# Patient Record
Sex: Female | Born: 1990
Health system: Southern US, Community
[De-identification: ages and names within clinical notes are randomized; demographics above are authoritative.]

## PROBLEM LIST (undated history)

## (undated) DIAGNOSIS — E079 Disorder of thyroid, unspecified: Secondary | ICD-10-CM

## (undated) DIAGNOSIS — F419 Anxiety disorder, unspecified: Secondary | ICD-10-CM

## (undated) HISTORY — DX: Disorder of thyroid, unspecified: E07.9

## (undated) HISTORY — DX: Anxiety disorder, unspecified: F41.9

---
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
## 2013-05-29 ENCOUNTER — Inpatient Hospital Stay (HOSPITAL_COMMUNITY)
Admission: AD | Admit: 2013-05-29 | Discharge: 2013-05-29 | Disposition: A | Discharge status: Home / Self Care | Payer: BC Managed Care – PPO | Source: Ambulatory Visit | Attending: Obstetrics & Gynecology | Admitting: Obstetrics & Gynecology

## 2013-05-29 NOTE — MAU Note (Signed)
Pt called, not in lobby 

## 2013-11-30 ENCOUNTER — Other Ambulatory Visit: Payer: BC Managed Care – PPO

## 2013-12-01 ENCOUNTER — Other Ambulatory Visit: Payer: BC Managed Care – PPO

## 2013-12-01 ENCOUNTER — Other Ambulatory Visit: Payer: Self-pay | Admitting: Endocrinology

## 2013-12-01 ENCOUNTER — Other Ambulatory Visit: Payer: Self-pay | Admitting: *Deleted

## 2013-12-01 DIAGNOSIS — E039 Hypothyroidism, unspecified: Secondary | ICD-10-CM | POA: Insufficient documentation

## 2013-12-02 LAB — T4, FREE: Free T4: 1.29 ng/dL (ref 0.80–1.80)

## 2013-12-02 LAB — TSH: TSH: 1.118 u[IU]/mL (ref 0.350–4.500)

## 2013-12-03 ENCOUNTER — Ambulatory Visit (INDEPENDENT_AMBULATORY_CARE_PROVIDER_SITE_OTHER): Payer: BC Managed Care – PPO | Admitting: Endocrinology

## 2013-12-03 ENCOUNTER — Encounter: Payer: Self-pay | Admitting: Endocrinology

## 2013-12-03 VITALS — BP 114/72 | HR 100 | Temp 97.8°F | Resp 16 | Ht 66.5 in | Wt 142.8 lb

## 2013-12-03 DIAGNOSIS — E063 Autoimmune thyroiditis: Secondary | ICD-10-CM | POA: Insufficient documentation

## 2013-12-03 NOTE — Progress Notes (Signed)
Patient ID: Alyssa Mccarty, female   DOB: 07-19-1991, 23 y.o.   MRN: [id number]    Reason for Appointment: Goiter followup visit   History of Present Illness:   Her detailed history is not available from previous records at present. However a couple of years ago she noticed a swelling in her neck and was found to have a goiter Evaluation showed that she had Hashimoto thyroiditis with positive antibodies but no hypothyroidism. Because of significant enlargement of her thyroid she was started on thyroid suppressive treatment Her thyroid enlargement was improving with this but she has not been seen in followup for several months now  She ran out of her medication about a month ago. Does not think she notices any swelling in her neck She does complain of feeling tired for about 3 months or so and not any worse without her thyroid supplement No complaints of cold intolerance or hair loss   Orders Only on 12/01/2013  Component Date Value Ref Range Status  . TSH 12/01/2013 1.118  0.350 - 4.500 uIU/mL Final  . Free T4 12/01/2013 1.29  0.80 - 1.80 ng/dL Final      Medication List       This list is accurate as of: 12/03/13  3:55 PM.  Always use your most recent med list.               levonorgestrel-ethinyl estradiol 0.15-0.03 MG tablet  Commonly known as:  SEASONALE,INTROVALE,JOLESSA     levothyroxine 75 MCG tablet  Commonly known as:  SYNTHROID, LEVOTHROID  Take 75 mcg by mouth daily.     LORazepam 0.5 MG tablet  Commonly known as:  ATIVAN  Take 0.5 mg by mouth every 8 (eight) hours.     rizatriptan 10 MG tablet  Commonly known as:  MAXALT     sertraline 100 MG tablet  Commonly known as:  ZOLOFT  Take 100 mg by mouth daily.        Allergies:  Allergies  Allergen Reactions  . Septra [Bactrim] Swelling    FACE SWELLING    No past medical history on file.  No past surgical history on file.  No family history on file.  Social History:  reports that she has never  smoked. She does not have any smokeless tobacco history on file. Her alcohol and drug histories are not on file.  REVIEW Of SYSTEMS:  History of migraines History of anxiety   Examination:   BP 114/72  Pulse 100  Temp(Src) 97.8 F (36.6 C)  Resp 16  Ht 5' 6.5" (1.689 m)  Wt 142 lb 12.8 oz (64.774 kg)  BMI 22.71 kg/m2  SpO2 98%  GENERAL: Pleasant and well-looking, no puffiness of the face or eyes  THYROID: Right lobe was just palpable, has minimal enlargement which is soft. Left lobe is not palpable         NEUROLOGIC EXAM:  biceps reflexes show normal relaxation Skin: Not unusual dry    Assessment   History of Hashimoto thyroiditis with goiter Currently the patient has almost complete resolution of her goiter She is still euthyroid without any thyroid supplement for at least a month   Treatment:  Will observe her without any thyroid supplement for 6 months If she has any increase in size of her goiter will restart thyroid supplement and also periodically follow thyroid function   Azilee Pirro 12/03/2013, 3:55 PM

## 2013-12-03 NOTE — Patient Instructions (Signed)
No Rx

## 2014-06-04 ENCOUNTER — Other Ambulatory Visit (INDEPENDENT_AMBULATORY_CARE_PROVIDER_SITE_OTHER): Payer: BC Managed Care – PPO

## 2014-06-04 DIAGNOSIS — E063 Autoimmune thyroiditis: Secondary | ICD-10-CM

## 2014-06-04 LAB — TSH: TSH: 0.59 u[IU]/mL (ref 0.35–5.50)

## 2014-06-04 LAB — T4, FREE: Free T4: 0.98 ng/dL (ref 0.60–1.60)

## 2014-06-10 ENCOUNTER — Encounter: Payer: Self-pay | Admitting: Endocrinology

## 2014-06-10 ENCOUNTER — Ambulatory Visit (INDEPENDENT_AMBULATORY_CARE_PROVIDER_SITE_OTHER): Payer: BC Managed Care – PPO | Admitting: Endocrinology

## 2014-06-10 VITALS — BP 124/80 | HR 112 | Temp 99.3°F | Ht 66.5 in | Wt 138.0 lb

## 2014-06-10 DIAGNOSIS — E063 Autoimmune thyroiditis: Secondary | ICD-10-CM

## 2014-06-10 NOTE — Progress Notes (Signed)
Patient ID: Alyssa Mccarty, female   DOB: Mar 10, 1991, 23 y.o.   MRN: [id number]    Reason for Appointment: Goiter followup visit   History of Present Illness:   However in 2010 she noticed a swelling in her neck and was found to have a goiter Evaluation showed that she had Hashimoto thyroiditis with positive antibodies with a titer of 625 but no hypothyroidism. Highest TSH in the past has been 5.5 Because of significant enlargement of her thyroid she was given thyroid suppressive treatment She was taking as much as 88 mcg Her thyroid enlargement and improved significantly with this   She told to stop her thyroid supplement in 3/19 and she is now here for followup She feels fairly well without any unusual fatigue She thinks her right neck is somewhat larger  Lab Results  Component Value Date   FREET4 0.98 06/04/2014   FREET4 1.29 12/01/2013   TSH 0.59 06/04/2014   TSH 1.118 12/01/2013       Medication List       This list is accurate as of: 06/10/14  3:35 PM.  Always use your most recent med list.               amphetamine-dextroamphetamine 30 MG 24 hr capsule  Commonly known as:  ADDERALL XR     levonorgestrel-ethinyl estradiol 0.15-0.03 MG tablet  Commonly known as:  SEASONALE,INTROVALE,JOLESSA     LORazepam 0.5 MG tablet  Commonly known as:  ATIVAN  Take 0.5 mg by mouth every 8 (eight) hours.     rizatriptan 10 MG tablet  Commonly known as:  MAXALT     sertraline 100 MG tablet  Commonly known as:  ZOLOFT  Take 100 mg by mouth daily.        Allergies:  Allergies  Allergen Reactions  . Septra [Bactrim] Swelling    FACE SWELLING    No past medical history on file.  No past surgical history on file.  No family history on file.  Social History:  reports that she has never smoked. She does not have any smokeless tobacco history on file. Her alcohol and drug histories are not on file.  REVIEW Of SYSTEMS:  History of migraines History of anxiety   Examination:   BP 124/80  Pulse 112  Temp(Src) 99.3 F (37.4 C) (Oral)  Ht 5' 6.5" (1.689 m)  Wt 138 lb (62.596 kg)  BMI 21.94 kg/m2  SpO2 97%  GENERAL: Pleasant and well-looking, no puffiness of the face or eyes  THYROID: Right lobe shows minimal enlargement which is soft. Left lobe is not palpable         NEUROLOGIC EXAM:  biceps reflexes show normal relaxation    Assessment   History of Hashimoto thyroiditis with goiter Again the patient has almost complete resolution of her goiter She is still euthyroid without any thyroid supplement since 10/2013   Treatment:  Will observe her without any thyroid supplement and follow annually Explained to patient how to do a self-exam of her thyroid and patient handout given  If she has any significant increase in size of her goiter in the future will restart thyroid supplement and also periodically follow thyroid function   Alyssa Mccarty 06/10/2014, 3:35 PM

## 2014-07-10 ENCOUNTER — Ambulatory Visit (INDEPENDENT_AMBULATORY_CARE_PROVIDER_SITE_OTHER): Payer: BC Managed Care – PPO | Admitting: Physician Assistant

## 2014-07-10 VITALS — BP 116/60 | HR 99 | Temp 98.4°F | Resp 20 | Ht 66.25 in | Wt 138.4 lb

## 2014-07-10 DIAGNOSIS — R1030 Lower abdominal pain, unspecified: Secondary | ICD-10-CM

## 2014-07-10 DIAGNOSIS — K5900 Constipation, unspecified: Secondary | ICD-10-CM

## 2014-07-10 DIAGNOSIS — R350 Frequency of micturition: Secondary | ICD-10-CM

## 2014-07-10 LAB — POCT URINALYSIS DIPSTICK
BILIRUBIN UA: NEGATIVE
Glucose, UA: NEGATIVE
Nitrite, UA: NEGATIVE
Protein, UA: NEGATIVE
RBC UA: NEGATIVE
Spec Grav, UA: 1.025
Urobilinogen, UA: 0.2
pH, UA: 6

## 2014-07-10 LAB — POCT UA - MICROSCOPIC ONLY
Casts, Ur, LPF, POC: NEGATIVE
Crystals, Ur, HPF, POC: NEGATIVE
MUCUS UA: NEGATIVE
RBC, URINE, MICROSCOPIC: NEGATIVE
Yeast, UA: NEGATIVE

## 2014-07-10 LAB — POCT URINE PREGNANCY: Preg Test, Ur: NEGATIVE

## 2014-07-10 MED ORDER — POLYETHYLENE GLYCOL 3350 17 GM/SCOOP PO POWD: ORAL | Status: DC

## 2014-07-10 NOTE — Progress Notes (Signed)
Subjective:    Patient ID: Alyssa ArchKelsey Mccarty, female    DOB: April 14, 1991, 23 y.o.   MRN: [id number]  HPI  This is a 23 year old female with PMH ADD, and anxiety who is presenting with 1 week of constipation and lower abdominal pain. She reports she usually has bowel movements every 2-3 days, stool is soft. After 4 days without a bowel movement she took a stool softener. The next day she had a liquid stool. Two days later she took another stool softener and the next day she had a small liquid stool. She has had crampy lower abdominal pain and bloating intermittently over the past week and has had some relief with bowel movements. She denies nausea, vomiting, blood in stool, dysuria. She has noticed some urinary frequency.  She is also noting that 4-5 weeks ago she missed 5 days of her OCP. She had breakthrough bleeding at that time. She took a pregnancy test 2 weeks after and negative. She is still worried about this. She takes seasonale and only gets a period every 90 days.  Review of Systems  Constitutional: Negative.   Gastrointestinal: Positive for abdominal pain, diarrhea and constipation. Negative for nausea, vomiting and blood in stool.  Genitourinary: Positive for frequency. Negative for dysuria.      Objective:   Physical Exam  Constitutional: She is oriented to person, place, and time. She appears well-developed and well-nourished. No distress.  HENT:  Head: Normocephalic and atraumatic.  Right Ear: Hearing normal.  Left Ear: Hearing normal.  Eyes: Conjunctivae and lids are normal. Right eye exhibits no discharge. Left eye exhibits no discharge. No scleral icterus.  Cardiovascular: Normal rate, regular rhythm, normal heart sounds and intact distal pulses.   Pulmonary/Chest: Effort normal and breath sounds normal. No respiratory distress. She has no wheezes. She has no rhonchi. She has no rales.  Abdominal: Soft. Normal appearance and bowel sounds are normal. She exhibits no distension  and no mass. There is no tenderness. There is no CVA tenderness.  Neurological: She is alert and oriented to person, place, and time.  Skin: Skin is warm, dry and intact. No rash noted. She is not diaphoretic.  Psychiatric: She has a normal mood and affect. Her speech is normal and behavior is normal. Thought content normal.    Results for orders placed in visit on 07/10/14  POCT UA - MICROSCOPIC ONLY      Result Value Ref Range   WBC, Ur, HPF, POC 5-7     RBC, urine, microscopic neg     Bacteria, U Microscopic 2+     Mucus, UA neg     Epithelial cells, urine per micros 5-7     Crystals, Ur, HPF, POC neg     Casts, Ur, LPF, POC neg     Yeast, UA neg    POCT URINALYSIS DIPSTICK      Result Value Ref Range   Color, UA yellow     Clarity, UA clear     Glucose, UA neg     Bilirubin, UA neg     Ketones, UA trace     Spec Grav, UA 1.025     Blood, UA neg     pH, UA 6.0     Protein, UA neg     Urobilinogen, UA 0.2     Nitrite, UA neg     Leukocytes, UA Trace    POCT URINE PREGNANCY      Result Value Ref Range   Preg  Test, Ur Negative        Assessment & Plan:  1. Frequency of urination 2. Lower abdominal pain  UA negative, uHCG negative. Urinary frequency is likely due to constipation.  - POCT UA - Microscopic Only - POCT urinalysis dipstick - POCT urine pregnancy  3. Constipation, unspecified constipation type  She will take 17 gm mirlax BID x 3 days, then 17 gm QD x 7 days. If no improvement, she will return to clinic.  - polyethylene glycol powder (GLYCOLAX/MIRALAX) powder; Take 1 capfull in 8 oz water BID x 3 days, then QD x 7 days  Dispense: 3350 g; Refill: 1   Cherish Runde V. Dyke BrackettBush, PA-C, MHS [facility]  07/10/2014

## 2014-07-10 NOTE — Patient Instructions (Signed)
Mix 1 capfull of miralax with 8 oz water twice a day for 3 days, then once a day for 7 days. If you are having diarrhea from this, you may cut back to 1/2 capfull.  Return if symptoms not improved in 7-10 days.

## 2014-07-11 NOTE — Progress Notes (Signed)
I was directly involved with the patient's care and agree with the physical, diagnosis and treatment plan.  

## 2014-07-25 ENCOUNTER — Ambulatory Visit (INDEPENDENT_AMBULATORY_CARE_PROVIDER_SITE_OTHER): Payer: BC Managed Care – PPO | Admitting: Family Medicine

## 2014-07-25 VITALS — BP 133/76 | HR 115 | Temp 98.9°F | Resp 20 | Ht 66.25 in | Wt 139.8 lb

## 2014-07-25 DIAGNOSIS — J069 Acute upper respiratory infection, unspecified: Secondary | ICD-10-CM

## 2014-07-25 MED ORDER — GUAIFENESIN ER 1200 MG PO TB12: 1.0000 | ORAL_TABLET | Freq: Two times a day (BID) | ORAL | Status: DC | PRN

## 2014-07-25 MED ORDER — IPRATROPIUM BROMIDE 0.03 % NA SOLN: 2.0000 | Freq: Two times a day (BID) | NASAL | Status: DC

## 2014-07-25 MED ORDER — HYDROCOD POLST-CHLORPHEN POLST 10-8 MG/5ML PO LQCR: 5.0000 mL | Freq: Two times a day (BID) | ORAL | Status: DC | PRN

## 2014-07-25 NOTE — Progress Notes (Signed)
Subjective:    Patient ID: Alyssa Mccarty, female    DOB: Feb 09, 1991, 23 y.o.   MRN: [id number] Patient Active Problem List   Diagnosis Date Noted  . Hashimoto's thyroiditis 12/03/2013  . Unspecified hypothyroidism 12/01/2013   Prior to Admission medications   Medication Sig Start Date End Date Taking? Authorizing Provider  amphetamine-dextroamphetamine (ADDERALL XR) 30 MG 24 hr capsule  06/03/14  Yes Historical Provider, MD  levonorgestrel-ethinyl estradiol (SEASONALE,INTROVALE,JOLESSA) 0.15-0.03 MG tablet  11/25/13  Yes Historical Provider, MD  LORazepam (ATIVAN) 0.5 MG tablet Take 0.5 mg by mouth every 8 (eight) hours.   Yes Historical Provider, MD  polyethylene glycol powder (GLYCOLAX/MIRALAX) powder Take 1 capfull in 8 oz water BID x 3 days, then QD x 7 days 07/10/14  Yes Lanier ClamNicole Dhriti Fales V, PA-C  rizatriptan (MAXALT) 10 MG tablet  10/08/13  Yes Historical Provider, MD  sertraline (ZOLOFT) 100 MG tablet Take 50 mg by mouth daily.    Yes Historical Provider, MD                        Allergies  Allergen Reactions  . Septra [Bactrim] Swelling    FACE SWELLING   HPI  This is a 23 year old female presenting with 3 days of cough, nasal congestion, hoarseness and sinus pressure. She is coughing up yellow mucous. The cough is worse at night and she is not sleeping well. She has tried Cater[employer] manageralka seltzer and cough drops with some relief. She denies sore throat, otalgia, fevers, chills, SOB or wheezing. She does not have any sick contacts. She does not have a history of asthma and she is not a smoker.  Review of Systems  Constitutional: Negative for fever and chills.  HENT: Positive for congestion, sinus pressure and voice change. Negative for ear pain and sore throat.   Eyes: Negative.   Respiratory: Positive for cough. Negative for shortness of breath and wheezing.   Gastrointestinal: Negative for nausea, vomiting, diarrhea and constipation.  Skin: Negative.       Objective:   Physical Exam    Constitutional: She is oriented to person, place, and time. She appears well-developed and well-nourished. No distress.  HENT:  Head: Normocephalic and atraumatic.  Right Ear: Hearing, tympanic membrane, external ear and ear canal normal.  Left Ear: Hearing, tympanic membrane, external ear and ear canal normal.  Nose: Mucosal edema present.  Mouth/Throat: Uvula is midline and mucous membranes are normal. Posterior oropharyngeal erythema present. No oropharyngeal exudate or posterior oropharyngeal edema.  Eyes: Conjunctivae and lids are normal. Right eye exhibits no discharge. Left eye exhibits no discharge. No scleral icterus.  Cardiovascular: Regular rhythm, normal heart sounds and normal pulses.   No murmur heard. Tachycardic today - normal for her. She is on amphetamine for ADD  Pulmonary/Chest: Effort normal and breath sounds normal. No respiratory distress. She has no wheezes. She has no rhonchi. She has no rales.  Musculoskeletal: Normal range of motion.  Lymphadenopathy:       Head (right side): No submental, no submandibular, no tonsillar, no preauricular, no posterior auricular and no occipital adenopathy present.       Head (left side): No submental, no submandibular, no tonsillar, no preauricular, no posterior auricular and no occipital adenopathy present.    She has cervical adenopathy.       Right cervical: Superficial cervical adenopathy present. No deep cervical and no posterior cervical adenopathy present.      Left cervical: Superficial cervical adenopathy  present. No deep cervical and no posterior cervical adenopathy present.  Neurological: She is alert and oriented to person, place, and time.  Skin: Skin is warm, dry and intact. No lesion and no rash noted.  Psychiatric: She has a normal mood and affect. Her speech is normal and behavior is normal. Thought content normal.  BP 133/76 mmHg  Pulse 115  Temp(Src) 98.9 F (37.2 C) (Oral)  Resp 20  Ht 5' 6.25" (1.683 m)  Wt  139 lb 12.8 oz (63.413 kg)  BMI 22.39 kg/m2  SpO2 98%     Assessment & Plan:  1. Viral URI This is likely a viral URI. Focus is on supportive care. Will return in 7-10 days if symptoms worsen or fail to improve.  - ipratropium (ATROVENT) 0.03 % nasal spray; Place 2 sprays into both nostrils 2 (two) times daily.  Dispense: 30 mL; Refill: 0 - chlorpheniramine-HYDROcodone (TUSSIONEX PENNKINETIC ER) 10-8 MG/5ML LQCR; Take 5 mLs by mouth every 12 (twelve) hours as needed for cough (cough).  Dispense: 100 mL; Refill: 0 - Guaifenesin (MUCINEX MAXIMUM STRENGTH) 1200 MG TB12; Take 1 tablet (1,200 mg total) by mouth every 12 (twelve) hours as needed.  Dispense: 14 tablet; Refill: 1   Alyssa Taylor V. Dyke BrackettBush, PA-C, MHS [facility]  07/26/2014

## 2014-07-25 NOTE — Patient Instructions (Addendum)
Drink plenty of water and get plenty of rest. Use atrovent and mucinex while you are having symptoms. Cough syrup will make you drowsy. Return in 7-10 days if not improving.

## 2014-08-04 NOTE — Progress Notes (Signed)
Reviewed documentation and agree w/ assessment and plan. Klair Leising, MD MPH 

## 2015-02-08 ENCOUNTER — Telehealth: Payer: Self-pay

## 2015-02-08 ENCOUNTER — Ambulatory Visit (INDEPENDENT_AMBULATORY_CARE_PROVIDER_SITE_OTHER): Payer: 59 | Admitting: Physician Assistant

## 2015-02-08 VITALS — BP 128/78 | HR 98 | Temp 98.7°F | Resp 16 | Ht 67.0 in | Wt 136.6 lb

## 2015-02-08 DIAGNOSIS — B9789 Other viral agents as the cause of diseases classified elsewhere: Secondary | ICD-10-CM

## 2015-02-08 DIAGNOSIS — R05 Cough: Secondary | ICD-10-CM | POA: Diagnosis not present

## 2015-02-08 DIAGNOSIS — R059 Cough, unspecified: Secondary | ICD-10-CM

## 2015-02-08 DIAGNOSIS — J988 Other specified respiratory disorders: Secondary | ICD-10-CM

## 2015-02-08 DIAGNOSIS — B349 Viral infection, unspecified: Secondary | ICD-10-CM

## 2015-02-08 LAB — POCT CBC
Granulocyte percent: 66.1 %G (ref 37–80)
HEMATOCRIT: 37 % — AB (ref 37.7–47.9)
Hemoglobin: 10.9 g/dL — AB (ref 12.2–16.2)
LYMPH, POC: 2.1 (ref 0.6–3.4)
MCH, POC: 21.3 pg — AB (ref 27–31.2)
MCHC: 29.5 g/dL — AB (ref 31.8–35.4)
MCV: 72.4 fL — AB (ref 80–97)
MID (cbc): 0.8 (ref 0–0.9)
MPV: 7.1 fL (ref 0–99.8)
POC Granulocyte: 5.6 (ref 2–6.9)
POC LYMPH %: 24.5 % (ref 10–50)
POC MID %: 9.4 %M (ref 0–12)
Platelet Count, POC: 318 10*3/uL (ref 142–424)
RBC: 5.11 M/uL (ref 4.04–5.48)
RDW, POC: 17.5 %
WBC: 8.5 10*3/uL (ref 4.6–10.2)

## 2015-02-08 MED ORDER — HYDROCOD POLST-CPM POLST ER 10-8 MG/5ML PO SUER: 5.0000 mL | Freq: Two times a day (BID) | ORAL | Status: DC | PRN

## 2015-02-08 MED ORDER — NAPROXEN 500 MG PO TABS: 500.0000 mg | ORAL_TABLET | Freq: Two times a day (BID) | ORAL | Status: DC

## 2015-02-08 MED ORDER — BENZONATATE 100 MG PO CAPS: 100.0000 mg | ORAL_CAPSULE | Freq: Three times a day (TID) | ORAL | Status: DC | PRN

## 2015-02-08 NOTE — Progress Notes (Signed)
02/08/2015 at 2:39 PM  Adelina MingsKelsey Carreira / DOB: [birth year] / MRN: [id number]  The patient has Unspecified hypothyroidism and Hashimoto's thyroiditis on her problem list.  SUBJECTIVE  Chief complaint: Cough  Manfred ArchKelsey Schlechter is a 24 y.o. female complaining of dry cough and sinus and nasal congestion that started 4 days ago.  Associated symptoms include sore throat today, and she denies fever, difficulty breathing, headache and jaw pain.The patient symptoms are worsening. Treatments tried thus far include Loratidine with fair  relief. She reports sick contacts.   She  has a past medical history of Anxiety and Thyroid disease.    Medications reviewed and updated by myself where necessary, and exist elsewhere in the encounter.   Ms. Sherlyn LickRedmon is allergic to septra. She  reports that she has never smoked. She has never used smokeless tobacco. She reports that she drinks alcohol. She reports that she does not use illicit drugs. She  has no sexual activity history on file. The patient  has no past surgical history on file.  Her family history includes Diabetes in her paternal grandfather; Hyperlipidemia in her father; Hypertension in her father.  Review of Systems  Constitutional: Negative for fever and chills.  Respiratory: Negative for shortness of breath and wheezing.   Cardiovascular: Negative for chest pain and palpitations.  Gastrointestinal: Negative for heartburn, nausea, vomiting, abdominal pain, diarrhea and constipation.  Genitourinary: Negative.   Musculoskeletal: Negative for myalgias.  Skin: Negative for itching and rash.  Neurological: Negative for dizziness and headaches.    OBJECTIVE  Her  height is 5\' 7"  (1.702 m) and weight is 136 lb 9.6 oz (61.961 kg). Her oral temperature is 98.7 F (37.1 C). Her blood pressure is 128/78 and her pulse is 98. Her respiration is 16 and oxygen saturation is 99%.  The patient's body mass index is 21.39 kg/(m^2).  Physical Exam  Constitutional:  She is oriented to person, place, and time. She appears well-developed and well-nourished.  HENT:  Right Ear: Hearing, external ear and ear canal normal.  Left Ear: Hearing, tympanic membrane, external ear and ear canal normal.  Nose: Mucosal edema present.  Mouth/Throat: Uvula is midline, oropharynx is clear and moist and mucous membranes are normal. No uvula swelling. No oropharyngeal exudate, posterior oropharyngeal edema, posterior oropharyngeal erythema or tonsillar abscesses.  Neck: Normal range of motion.  Cardiovascular: Normal rate and regular rhythm.   Respiratory: Effort normal and breath sounds normal. No respiratory distress. She has no wheezes. She has no rales. She exhibits no tenderness.  GI: Soft. Bowel sounds are normal.  Musculoskeletal: Normal range of motion.  Neurological: She is alert and oriented to person, place, and time.  Skin: Skin is warm and dry.  Psychiatric: She has a normal mood and affect.    Results for orders placed or performed in visit on 02/08/15 (from the past 24 hour(s))  POCT CBC     Status: Abnormal   Collection Time: 02/08/15  2:28 PM  Result Value Ref Range   WBC 8.5 4.6 - 10.2 K/uL   Lymph, poc 2.1 0.6 - 3.4   POC LYMPH PERCENT 24.5 10 - 50 %L   MID (cbc) 0.8 0 - 0.9   POC MID % 9.4 0 - 12 %M   POC Granulocyte 5.6 2 - 6.9   Granulocyte percent 66.1 37 - 80 %G   RBC 5.11 4.04 - 5.48 M/uL   Hemoglobin 10.9 (A) 12.2 - 16.2 g/dL   HCT, POC 46.937.0 (A) 62.937.7 - 47.9 %  MCV 72.4 (A) 80 - 97 fL   MCH, POC 21.3 (A) 27 - 31.2 pg   MCHC 29.5 (A) 31.8 - 35.4 g/dL   RDW, POC 29.5 %   Platelet Count, POC 318 142 - 424 K/uL   MPV 7.1 0 - 99.8 fL    ASSESSMENT & PLAN  Karrigan was seen today for cough.  Diagnoses and all orders for this visit:  Cough: CBC and exam reassuring.  Will treat conservatively for now.  Patient advised to call if she develops SOB, DOE, chest pain, or fever greater than 101.   Orders: -     POCT CBC -     benzonatate  (TESSALON) 100 MG capsule; Take 1-2 capsules (100-200 mg total) by mouth 3 (three) times daily as needed for cough. -     Tussionex: Patient with allergy according to epic, however she has received this in the last three months and tolerated without adverse effects.   Viral respiratory infection Orders: -     naproxen (NAPROSYN) 500 MG tablet; Take 1 tablet (500 mg total) by mouth 2 (two) times daily with a meal.    The patient was advised to call or come back to clinic if she does not see an improvement in symptoms, or worsens with the above plan.   Deliah Boston, MHS, PA-C [facility] 02/08/2015 2:39 PM

## 2015-02-08 NOTE — Telephone Encounter (Signed)
Done.  Upfront and waiting for pt.

## 2015-02-08 NOTE — Telephone Encounter (Signed)
Alyssa Mccarty,    Patient would like cough medicine called into pharmacy.   States she had no trouble with it last fall when Lanier ClamNicole Bush prescribed it for her.   Patient was seen today.    [phone] (H)

## 2015-02-10 NOTE — Telephone Encounter (Signed)
Pt.notified

## 2016-09-27 ENCOUNTER — Encounter (HOSPITAL_COMMUNITY): Payer: Self-pay

## 2016-10-11 ENCOUNTER — Ambulatory Visit: Payer: Self-pay | Admitting: Psychology

## 2016-12-05 ENCOUNTER — Encounter (HOSPITAL_COMMUNITY): Payer: Self-pay | Admitting: Psychiatry

## 2016-12-05 ENCOUNTER — Ambulatory Visit (HOSPITAL_COMMUNITY): Payer: Self-pay | Admitting: Psychiatry

## 2016-12-05 ENCOUNTER — Ambulatory Visit (INDEPENDENT_AMBULATORY_CARE_PROVIDER_SITE_OTHER): Payer: 59 | Admitting: Psychiatry

## 2016-12-05 VITALS — BP 120/76 | HR 100 | Ht 66.0 in | Wt 155.2 lb

## 2016-12-05 DIAGNOSIS — Z79899 Other long term (current) drug therapy: Secondary | ICD-10-CM

## 2016-12-05 DIAGNOSIS — F902 Attention-deficit hyperactivity disorder, combined type: Secondary | ICD-10-CM | POA: Diagnosis not present

## 2016-12-05 DIAGNOSIS — F411 Generalized anxiety disorder: Secondary | ICD-10-CM

## 2016-12-05 MED ORDER — AMPHETAMINE-DEXTROAMPHETAMINE 15 MG PO TABS: 15.0000 mg | ORAL_TABLET | Freq: Every day | ORAL | 0 refills | Status: DC

## 2016-12-05 MED ORDER — SERTRALINE HCL 100 MG PO TABS
100.0000 mg | ORAL_TABLET | Freq: Every day | ORAL | 2 refills | Status: DC
Start: 2016-12-05 — End: 2017-02-27

## 2016-12-05 MED ORDER — AMPHETAMINE-DEXTROAMPHET ER 30 MG PO CP24: 30.0000 mg | ORAL_CAPSULE | Freq: Every day | ORAL | 0 refills | Status: DC

## 2016-12-05 MED ORDER — HYDROXYZINE PAMOATE 25 MG PO CAPS: 25.0000 mg | ORAL_CAPSULE | Freq: Two times a day (BID) | ORAL | 1 refills | Status: DC | PRN

## 2016-12-05 NOTE — Progress Notes (Signed)
Psychiatric Initial Adult Assessment   Patient Identification: Alyssa Mccarty MRN:  [id number] Date of Evaluation:  12/05/2016 Referral Source: ADHD Chief Complaint:  adhd, anxiety med management, switching providers Visit Diagnosis:    ICD-9-CM ICD-10-CM   1. GAD (generalized anxiety disorder) 300.02 F41.1 sertraline (ZOLOFT) 100 MG tablet     hydrOXYzine (VISTARIL) 25 MG capsule  2. Attention deficit hyperactivity disorder (ADHD), combined type 314.01 F90.2 amphetamine-dextroamphetamine (ADDERALL XR) 30 MG 24 hr capsule     amphetamine-dextroamphetamine (ADDERALL XR) 30 MG 24 hr capsule     sertraline (ZOLOFT) 100 MG tablet     amphetamine-dextroamphetamine (ADDERALL) 15 MG tablet     amphetamine-dextroamphetamine (ADDERALL) 15 MG tablet   History of Present Illness:  Patient presents today to establish psychiatric care, in the context of recent upcoming graduation from [employer]. She reports that she has been managed for ADHD and anxiety at [facility] for the past 3-4 years.  Her regular psychiatrist recently left, and her care was taken over by a PA provider, who she does not feel as comfortable with.  We spent time reviewing the patient's social history, family history, and her leisure activities. She lives with her boyfriend, who is 53 years old and works as a Charity fundraiser. She reports that they've been together for 3 years. Not currently planning to have children in the next few years. She reports that she feels safe with him, and is treated well. She gets along well with her parents who live locally in [place]. She gets along well with her 86 year old sister who attends [employer] for college. She drinks alcohol about 2 drinks nightly, and does not use any drugs or nicotine.  Spent time reviewing her past psychiatric history, and the onset of her anxiety and ADHD symptoms. It appears that college was a trigger for worsening and attention, with her grades struggling in the first 2-3 years,  and she finally sought help for her anxiety about her schooling, and her symptoms of inattention and psychomotor restlessness. She also was struggling with task procrastination, avoidance of complex activities, inattention and forgetfulness. She reports that she had testing done at [facility] which was consistent with ADHD, and has been treated for a few years with Adderall X are and IR as needed. She has tolerated the treatment well, with calming and focusing effect.  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 It's gotten to the point recently where she is avoidant of taking trips with her boyfriend, and she wonders about therapies to help her reduce her anxiety. She is open to individual psychotherapy, but specifically wonders about medication changes. We discussed an increase in her Zoloft to 100 mg, and the associated risks and benefits. She was agreeable to this change, and to use Vistaril as needed for sleep and anxiety or anxiety related to plane rides.  She denies any significant depressive symptoms, denies any suicidality. She's never had any episodes consistent with mania or psychosis. She tends to keep a good appetite, and tends to sleep fairly well at night, unless she is nervous or worried. She admits that she drinks alcohol at the end of the day, 2 glasses of wine, to help herself unwind and relax. I spent time discussing the potential interaction between alcohol and her medications, and recommended that she limit herself to 3 days a week, and only 1-2 glasses. He was agreeable to making this change.  Medically, she remains on thyroid supplement  for her history of Hashimotos thyroiditis and goiter. No other active medical issues at this time.  She agrees to follow-up with Clinical research associate in 2 months. Has no acute questions or concerns this time.  Fill Date Product, Str, Form Qty Days Pt ID Prescriber Written RX# N/R* Pharm **MED+ ----------  -------------------------------- ------ ---- --------- ---------- ---------- ------------ ----- --------- ------ 11/21/2016 ADDERALL XR 30 MG CAPSULE 30.00 30 [id number] 11/19/2016 [id number] N [id number] 00.0 11/21/2016 DEXTROAMP-AMPHETAMIN 15 MG TAB 30.00 30 [id number] 11/19/2016 425956 N [id number] 00.0 10/25/2016 DEXTROAMP-AMPHETAMIN 15 MG TAB 30.00 30 [id number] 10/25/2016 093235 N [id number] 00.0 10/25/2016 ADDERALL XR 30 MG CAPSULE 30.00 30 [id number] 10/25/2016 220423 N [id number] 00.0 09/29/2016 ADDERALL XR 30 MG CAPSULE 30.00 30 [id number] 09/29/2016 220101 N [id number] 00.0 09/27/2016 DEXTROAMP-AMPHETAMIN 15 MG TAB 30.00 30 [id number] 09/27/2016 220079 N [id number] 00.0 08/27/2016 ADDERALL XR 30 MG CAPSULE 30.00 30 [id number] 08/27/2016 950932 N [id number] 00.0 08/27/2016 DEXTROAMP-AMPHETAMIN 15 MG TAB 30.00 30 [id number] 08/27/2016 937902 N [id number] 00.0 07/30/2016 DEXTROAMP-AMPHETAMIN 15 MG TAB 30.00 30 [id number] 07/30/2016 892119 N [id number] 00.0 07/30/2016 ADDERALL XR 30 MG CAPSULE 30.00 30 [id number] 07/30/2016 885027 N [id number] 00.0 06/29/2016 DEXTROAMP-AMPHETAMIN 15 MG TAB 30.00 30 [id number] 06/29/2016 476546 N [id number] 00.0 06/29/2016 ADDERALL XR 30 MG CAPSULE 30.00 30 [id number] 06/29/2016 591638 N [id number] 00.0 05/25/2016 DEXTROAMP-AMPHETAMIN 15 MG TAB 30.00 30 [id number] 05/24/2016 762263 N [id number] 00.0 05/25/2016 ADDERALL XR 30 MG CAPSULE 30.00 30 [id number] 05/24/2016 620355 N [id number] 00.0 04/20/2016 DEXTROAMP-AMPHETAMIN 15 MG TAB 30.00 30 [id number] 04/20/2016 370488 N [id number] 00.0 04/20/2016 ADDERALL XR 30 MG CAPSULE 30.00 30 [id number] 04/20/2016 697948 N [id number] 00.0 03/22/2016 DEXTROAMP-AMPHETAMIN 15 MG TAB 30.00 30 [id number] 03/22/2016 121975 N [id number] 00.0 03/22/2016 ADDERALL XR 30 MG CAPSULE 30.00 30 [id number]  03/22/2016 103159 N [id number] 00.0 02/10/2016 ADDERALL XR 30 MG CAPSULE 30.00 30 [id number] 02/10/2016 383338 N [id number] 00.0 02/10/2016 DEXTROAMP-AMPHETAMIN 15 MG TAB 30.00 30 [id number] 02/10/2016 202334 N [id number] 00.0 01/13/2016 ADDERALL XR 30 MG CAPSULE 30.00 30 [id number] 01/12/2016 [id number] N [id number] 00.0 01/13/2016 DEXTROAMP-AMPHETAMIN 15 MG TAB 30.00 30 [id number] 01/12/2016 [id number] N [id number] 00.0 12/16/2015 ADDERALL XR 30 MG CAPSULE 30.00 30 [id number] 12/16/2015 [id number] N [id number] 00.0 12/16/2015 DEXTROAMP-AMPHETAMIN 15 MG TAB 30.00 30 [id number] 12/16/2015 [id number] N [id number] 00.0 *N/R N=New R=Refill +MED Daily Prescribers for prescriptions listed ---------------------------------------------------------------------------------------------------------------------------------- [id number] STEVENSON, AMY RENE DO; [address], [place] [address]  Associated Signs/Symptoms: Depression Symptoms:  anxiety, panic attacks, (Hypo) Manic Symptoms:  none Anxiety Symptoms:  Excessive Worry, Panic Symptoms, Psychotic Symptoms:  none PTSD Symptoms: Negative  Past Psychiatric History: ADHD diagnosed about 4 years ago in college,  History of anxiety and panic attacks on airplanes, prior psychiatric hospitalizations, no suicidality  Previous Psychotropic Medications: Yes  Substance Abuse History in the last 12 months:  No.  Consequences of Substance Abuse: Negative  Past Medical History:  Past Medical History:  Diagnosis Date  . Anxiety   . Thyroid disease    No past surgical history on file.  Family Psychiatric History: sister - anxiety  Family History:  Family History  Problem Relation Age of Onset  . Hyperlipidemia Father   . Hypertension Father   . Diabetes Paternal Grandfather     Social History:   Social History   Social History  . Marital status: Single    Spouse name: N/A  .  Number of  children: N/A  . Years of education: N/A   Social History Main Topics  . Smoking status: Never Smoker  . Smokeless tobacco: Never Used  . Alcohol use Yes     Comment: social use  . Drug use: No  . Sexual activity: Not on file   Other Topics Concern  . Not on file   Social History Narrative  . 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 Septra [Bactrim] Swelling    FACE SWELLING    Metabolic Disorder Labs: No results found for: HGBA1C, MPG No results found for: PROLACTIN No results found for: CHOL, TRIG, HDL, CHOLHDL, VLDL, LDLCALC   Current Medications: Current Outpatient Prescriptions  Medication Sig Dispense Refill  . amphetamine-dextroamphetamine (ADDERALL XR) 30 MG 24 hr capsule Take 1 capsule (30 mg total) by mouth daily. 30 capsule 0  . [START ON 01/02/2017] amphetamine-dextroamphetamine (ADDERALL XR) 30 MG 24 hr capsule Take 1 capsule (30 mg total) by mouth daily. 30 capsule 0  . amphetamine-dextroamphetamine (ADDERALL) 15 MG tablet Take 1 tablet by mouth daily. 30 tablet 0  . [START ON 01/02/2017] amphetamine-dextroamphetamine (ADDERALL) 15 MG tablet Take 1 tablet by mouth daily. 30 tablet 0  . hydrOXYzine (VISTARIL) 25 MG capsule Take 1 capsule (25 mg total) by mouth 3 times/day as needed-between meals & bedtime for anxiety (sleep). 30 capsule 1  . levonorgestrel-ethinyl estradiol (SEASONALE,INTROVALE,JOLESSA) 0.15-0.03 MG tablet     . sertraline (ZOLOFT) 100 MG tablet Take 1 tablet (100 mg total) by mouth daily. 30 tablet 2   No current facility-administered medications for this visit.     Neurologic: Headache: Negative Seizure: Negative Paresthesias:Negative  Musculoskeletal: Strength & Muscle Tone: within normal limits Gait & Station: normal Patient leans: N/A  Psychiatric Specialty Exam: ROS  There were no vitals taken for this visit.There  is no height or weight on file to calculate BMI.  General Appearance: Casual  Eye Contact:  Good  Speech:  Clear and Coherent  Volume:  Normal  Mood:  Euthymic  Affect:  Congruent  Thought Process:  Coherent  Orientation:  Full (Time, Place, and Person)  Thought Content:  Logical  Suicidal Thoughts:  No  Homicidal Thoughts:  No  Memory:  Immediate;   Fair  Judgement:  Good  Insight:  Good  Psychomotor Activity:  Normal  Concentration:  Concentration: Good and Attention Span: Good  Recall:  NA  Fund of Knowledge:Good  Language: Good  Akathisia:  Negative  Handed:  Right  AIMS (if indicated):  na  Assets:  Communication Skills Desire for Improvement Financial Resources/Insurance Housing Intimacy Leisure Time Physical Health Resilience Social Support Talents/Skills Transportation Vocational/Educational  ADL's:  Intact  Cognition: WNL  Sleep:  8-9 hours nightly    Treatment Plan Summary: Alyssa Mccarty is a 26 year old female with a history of generalized anxiety disorder and panic attacks in the context of airplanes, and ADHD, who presents today for psychiatric medication management. She is transferring her care from her PA provider at [facility] [employer], to this Clinical research associatewriter, in anticipation of upcoming graduation and needing to establish psychiatric care outside of school. He does not present with any acute safety issues. Given some continued anxiety and worry, we will titrate Zoloft to 100 mg daily  1. GAD (generalized anxiety disorder)   2. Attention deficit hyperactivity disorder (ADHD), combined type    - Increase Zoloft to 100  mg daily - Continue Adderall XR 30 mg daily, and 15 mg IR as needed daily - Continue weekend stimulant holidays as tolerated - Recommended that the patient consider exposure therapy for airplane phobia - Follow-up with Clinical research associate in 2 months  Burnard Leigh, MD 3/21/20189:45 AM

## 2016-12-05 NOTE — Patient Instructions (Signed)
Increase Zoloft to 100 mg (1 tablet)  Continue Adderall XR and Immediate release as usual  Use vistaril at night for sleep, or about 30 minutes before a plane ride for anxiety

## 2017-02-04 ENCOUNTER — Ambulatory Visit (HOSPITAL_COMMUNITY): Payer: Self-pay | Admitting: Psychiatry

## 2017-02-18 ENCOUNTER — Telehealth (HOSPITAL_COMMUNITY): Payer: Self-pay

## 2017-02-18 DIAGNOSIS — F902 Attention-deficit hyperactivity disorder, combined type: Secondary | ICD-10-CM

## 2017-02-18 MED ORDER — AMPHETAMINE-DEXTROAMPHETAMINE 15 MG PO TABS: 15.0000 mg | ORAL_TABLET | Freq: Every day | ORAL | 0 refills | Status: DC

## 2017-02-18 NOTE — Telephone Encounter (Signed)
Sure we can provide a refill

## 2017-02-18 NOTE — Telephone Encounter (Signed)
Patient had to reschedule her appointment to next week and is out of Adderall, can you refill? Please review and advise, thank you

## 2017-02-18 NOTE — Telephone Encounter (Signed)
Printed prescription and called patient to come and pick up

## 2017-02-27 ENCOUNTER — Ambulatory Visit (INDEPENDENT_AMBULATORY_CARE_PROVIDER_SITE_OTHER): Payer: 59 | Admitting: Psychiatry

## 2017-02-27 DIAGNOSIS — F40298 Other specified phobia: Secondary | ICD-10-CM

## 2017-02-27 DIAGNOSIS — Z888 Allergy status to other drugs, medicaments and biological substances status: Secondary | ICD-10-CM

## 2017-02-27 DIAGNOSIS — F411 Generalized anxiety disorder: Secondary | ICD-10-CM

## 2017-02-27 DIAGNOSIS — F902 Attention-deficit hyperactivity disorder, combined type: Secondary | ICD-10-CM

## 2017-02-27 DIAGNOSIS — Z79899 Other long term (current) drug therapy: Secondary | ICD-10-CM

## 2017-02-27 DIAGNOSIS — F40243 Fear of flying: Secondary | ICD-10-CM | POA: Diagnosis not present

## 2017-02-27 MED ORDER — AMPHETAMINE-DEXTROAMPHETAMINE 15 MG PO TABS: 15.0000 mg | ORAL_TABLET | Freq: Every day | ORAL | 0 refills | Status: DC

## 2017-02-27 MED ORDER — SERTRALINE HCL 100 MG PO TABS: 100.0000 mg | ORAL_TABLET | Freq: Every day | ORAL | 1 refills | Status: DC

## 2017-02-27 MED ORDER — AMPHETAMINE-DEXTROAMPHET ER 30 MG PO CP24: 30.0000 mg | ORAL_CAPSULE | Freq: Every day | ORAL | 0 refills | Status: DC

## 2017-02-27 NOTE — Progress Notes (Signed)
BH MD/PA/NP OP Progress Note  02/27/2017 4:27 PM Alyssa Mccarty  MRN:  [id number]  Chief Complaint: med check Subjective:  Alyssa Mccarty presents for a brief medication check. Zoloft 100 mg has been well tolerated, and her anxiety is under much better control. She is interested in working with a therapist for exposure therapy to get a handle on her airplane phobia. She denies any acute suicidality or unsafe thoughts. She continues to use Adderall for ADHD symptoms, and is currently in summer school to graduate at the end of summer. She agrees to follow-up with writer in 3 months or sooner if needed. Her home environment and relationship with boyfriend remained good.  Visit Diagnosis:    ICD-10-CM   1. Specific phobia F40.298 Ambulatory referral to Psychology  2. GAD (generalized anxiety disorder) F41.1 sertraline (ZOLOFT) 100 MG tablet    Ambulatory referral to Psychology  3. Attention deficit hyperactivity disorder (ADHD), combined type F90.2 sertraline (ZOLOFT) 100 MG tablet    amphetamine-dextroamphetamine (ADDERALL) 15 MG tablet    amphetamine-dextroamphetamine (ADDERALL XR) 30 MG 24 hr capsule    DISCONTINUED: amphetamine-dextroamphetamine (ADDERALL XR) 30 MG 24 hr capsule    DISCONTINUED: amphetamine-dextroamphetamine (ADDERALL) 15 MG tablet    DISCONTINUED: amphetamine-dextroamphetamine (ADDERALL) 15 MG tablet    DISCONTINUED: amphetamine-dextroamphetamine (ADDERALL XR) 30 MG 24 hr capsule    Past Psychiatric History: See intake H&P for full details. Reviewed, with no updates at this time.   Past Medical History:  Past Medical History:  Diagnosis Date  . Anxiety   . Thyroid disease    No past surgical history on file.  Family Psychiatric History: See intake H&P for full details. Reviewed, with no updates at this time.   Family History:  Family History  Problem Relation Age of Onset  . Hyperlipidemia Father   . Hypertension Father   . Diabetes Paternal Grandfather      Social History:  Social History   Social History  . Marital status: Single    Spouse name: N/A  . Number of children: N/A  . Years of education: N/A   Social History Main Topics  . Smoking status: Never Smoker  . Smokeless tobacco: Never Used  . Alcohol use Yes     Comment: social use  . Drug use: No  . Sexual activity: Yes    Birth control/ protection: Pill   Other Topics Concern  . Not on file   Social History Narrative  . No narrative on file    Allergies:  Allergies  Allergen Reactions  . Septra [Bactrim] Swelling    FACE SWELLING    Metabolic Disorder Labs: No results found for: HGBA1C, MPG No results found for: PROLACTIN No results found for: CHOL, TRIG, HDL, CHOLHDL, VLDL, LDLCALC   Current Medications: Current Outpatient Prescriptions  Medication Sig Dispense Refill  . [START ON 04/24/2017] amphetamine-dextroamphetamine (ADDERALL XR) 30 MG 24 hr capsule Take 1 capsule (30 mg total) by mouth daily. 30 capsule 0  . [START ON 04/24/2017] amphetamine-dextroamphetamine (ADDERALL) 15 MG tablet Take 1 tablet by mouth daily. 30 tablet 0  . hydrOXYzine (VISTARIL) 25 MG capsule Take 1 capsule (25 mg total) by mouth 3 times/day as needed-between meals & bedtime for anxiety (sleep). 30 capsule 1  . levonorgestrel-ethinyl estradiol (SEASONALE,INTROVALE,JOLESSA) 0.15-0.03 MG tablet     . levothyroxine (SYNTHROID, LEVOTHROID) 50 MCG tablet Take 50 mcg by mouth daily before breakfast.    . sertraline (ZOLOFT) 100 MG tablet Take 1 tablet (100 mg total) by mouth daily.  90 tablet 1   No current facility-administered medications for this visit.     Neurologic: Headache: Negative Seizure: Negative Paresthesias: Negative  Musculoskeletal: Strength & Muscle Tone: within normal limits Gait & Station: normal Patient leans: N/A  Psychiatric Specialty Exam: ROS  There were no vitals taken for this visit.There is no height or weight on file to calculate BMI.  General  Appearance: Casual and Well Groomed  Eye Contact:  Good  Speech:  Clear and Coherent  Volume:  Normal  Mood:  Euthymic  Affect:  Appropriate and Congruent  Thought Process:  Goal Directed  Orientation:  Full (Time, Place, and Person)  Thought Content: Logical   Suicidal Thoughts:  No  Homicidal Thoughts:  No  Memory:  Immediate;   Good  Judgement:  Good  Insight:  Good  Psychomotor Activity:  Normal  Concentration:  Concentration: Good  Recall:  NA  Fund of Knowledge: Good  Language: Good  Akathisia:  Negative  Handed:  Right  AIMS (if indicated):  0  Assets:  Communication Skills Desire for Improvement Financial Resources/Insurance Housing Intimacy Leisure Time Physical Health Resilience Social Support Talents/Skills Transportation Vocational/Educational  ADL's:  Intact  Cognition: WNL  Sleep:  7-8 hours    Treatment Plan Summary: Alyssa Mccarty is a 26 year old female with generalized anxiety disorder and ADHD. Her symptoms are well managed with the current medication regimen. She does have a specific phobia related to flying, and would like to work with a therapist on exposure therapy and visualization to be able to master this anxiety.  1. Specific phobia   2. GAD (generalized anxiety disorder)   3. Attention deficit hyperactivity disorder (ADHD), combined type    Continue Zoloft 100 mg daily Continue Adderall 30 mg XR daily Continue Adderall 15 mg IR as needed Return to clinic in 3 months; 3 months of stimulant prescribed at this visit Referral for psychology  Burnard Leigh, MD 02/27/2017, 4:27 PM

## 2017-05-30 ENCOUNTER — Ambulatory Visit (INDEPENDENT_AMBULATORY_CARE_PROVIDER_SITE_OTHER): Payer: 59 | Admitting: Psychiatry

## 2017-05-30 ENCOUNTER — Encounter (HOSPITAL_COMMUNITY): Payer: Self-pay | Admitting: Psychiatry

## 2017-05-30 DIAGNOSIS — F411 Generalized anxiety disorder: Secondary | ICD-10-CM

## 2017-05-30 DIAGNOSIS — F902 Attention-deficit hyperactivity disorder, combined type: Secondary | ICD-10-CM | POA: Diagnosis not present

## 2017-05-30 MED ORDER — AMPHETAMINE-DEXTROAMPHET ER 30 MG PO CP24: 30.0000 mg | ORAL_CAPSULE | Freq: Every day | ORAL | 0 refills | Status: DC

## 2017-05-30 MED ORDER — SERTRALINE HCL 100 MG PO TABS: 100.0000 mg | ORAL_TABLET | Freq: Every day | ORAL | 1 refills | Status: DC

## 2017-05-30 MED ORDER — AMPHETAMINE-DEXTROAMPHETAMINE 15 MG PO TABS: 15.0000 mg | ORAL_TABLET | Freq: Every day | ORAL | 0 refills | Status: DC

## 2017-05-30 MED ORDER — HYDROXYZINE HCL 10 MG PO TABS: 10.0000 mg | ORAL_TABLET | Freq: Every evening | ORAL | 0 refills | Status: DC | PRN

## 2017-05-30 NOTE — Progress Notes (Signed)
BH MD/PA/NP OP Progress Note  05/30/2017 3:33 PM Seeley Southgate  MRN:  [id number]  Chief Complaint: med check HPI: Alyssa Mccarty reports that her anxiety and ADHD are well managed. She graduated from with her biology degree over the summer, I spent time applauding her on this. 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 She reports that things are good with her boyfriend. She denies any particular anxiety or panic episodes. Reports that she is sleeping well at night with hydroxyzine 5-10 mg instead of the 25 mg capsule. She denies any acute medical concerns. Denies any side effects from the medications. Reports that the Adderall XR plus immediate release 10 to work really well for her focus and attention and she feels comfortable with the current doses.  Visit Diagnosis:    ICD-10-CM   1. GAD (generalized anxiety disorder) F41.1 hydrOXYzine (ATARAX/VISTARIL) 10 MG tablet    sertraline (ZOLOFT) 100 MG tablet  2. Attention deficit hyperactivity disorder (ADHD), combined type F90.2 sertraline (ZOLOFT) 100 MG tablet    amphetamine-dextroamphetamine (ADDERALL) 15 MG tablet    amphetamine-dextroamphetamine (ADDERALL XR) 30 MG 24 hr capsule    amphetamine-dextroamphetamine (ADDERALL XR) 30 MG 24 hr capsule    amphetamine-dextroamphetamine (ADDERALL XR) 30 MG 24 hr capsule    amphetamine-dextroamphetamine (ADDERALL) 15 MG tablet    amphetamine-dextroamphetamine (ADDERALL) 15 MG tablet    Past Psychiatric History: See intake H&P for full details. Reviewed, with no updates at this time.   Past Medical History:  Past Medical History:  Diagnosis Date  . Anxiety   . Thyroid disease    History reviewed. No pertinent surgical history.  Family Psychiatric History: See intake H&P for full details. Reviewed, with no updates at this time.   Family History:  Family History   Problem Relation Age of Onset  . Hyperlipidemia Father   . Hypertension Father   . Diabetes Paternal Grandfather     Social History:  Social History   Social History  . Marital status: Single    Spouse name: N/A  . Number of children: N/A  . Years of education: N/A   Social History Main Topics  . Smoking status: Never Smoker  . Smokeless tobacco: Never Used  . Alcohol use Yes     Comment: social use  . Drug use: No  . Sexual activity: Yes    Birth control/ protection: Pill   Other Topics Concern  . None   Social History Narrative  . None    Allergies:  Allergies  Allergen Reactions  . Septra [Bactrim] Swelling    FACE SWELLING    Metabolic Disorder Labs: No results found for: HGBA1C, MPG No results found for: PROLACTIN No results found for: CHOL, TRIG, HDL, CHOLHDL, VLDL, LDLCALC Lab Results  Component Value Date   TSH 0.59 06/04/2014   TSH 1.118 12/01/2013    Therapeutic Level Labs: No results found for: LITHIUM No results found for: VALPROATE No components found for:  CBMZ  Current Medications: Current Outpatient Prescriptions  Medication Sig Dispense Refill  . amphetamine-dextroamphetamine (ADDERALL XR) 30 MG 24 hr capsule Take 1 capsule (30 mg total) by mouth daily. 30 capsule 0  . amphetamine-dextroamphetamine (ADDERALL) 15 MG tablet Take 1 tablet by mouth daily. 30 tablet 0  . levonorgestrel-ethinyl estradiol (SEASONALE,INTROVALE,JOLESSA) 0.15-0.03 MG tablet     . levothyroxine (SYNTHROID, LEVOTHROID) 50 MCG tablet Take 50 mcg  by mouth daily before breakfast.    . sertraline (ZOLOFT) 100 MG tablet Take 1 tablet (100 mg total) by mouth daily. 90 tablet 1  . [START ON 06/29/2017] amphetamine-dextroamphetamine (ADDERALL XR) 30 MG 24 hr capsule Take 1 capsule (30 mg total) by mouth daily. 30 capsule 0  . [START ON 07/29/2017] amphetamine-dextroamphetamine (ADDERALL XR) 30 MG 24 hr capsule Take 1 capsule (30 mg total) by mouth daily. 30 capsule 0  .  [START ON 06/29/2017] amphetamine-dextroamphetamine (ADDERALL) 15 MG tablet Take 1 tablet by mouth daily. 30 tablet 0  . [START ON 07/29/2017] amphetamine-dextroamphetamine (ADDERALL) 15 MG tablet Take 1 tablet by mouth daily. 30 tablet 0  . hydrOXYzine (ATARAX/VISTARIL) 10 MG tablet Take 1 tablet (10 mg total) by mouth at bedtime as needed. 90 tablet 0   No current facility-administered medications for this visit.      Musculoskeletal: Strength & Muscle Tone: within normal limits Gait & Station: normal Patient leans: N/A  Psychiatric Specialty Exam: ROS  Blood pressure 122/64, pulse 66, height 5\' 7"  (1.702 m), weight 155 lb (70.3 kg).Body mass index is 24.28 kg/m.  General Appearance: Casual and Fairly Groomed  Eye Contact:  Fair  Speech:  Clear and Coherent  Volume:  Normal  Mood:  Euthymic  Affect:  Appropriate and Congruent  Thought Process:  Goal Directed  Orientation:  Full (Time, Place, and Person)  Thought Content: Logical   Suicidal Thoughts:  No  Homicidal Thoughts:  No  Memory:  Immediate;   Fair  Judgement:  Good  Insight:  Good  Psychomotor Activity:  Normal  Concentration:  Concentration: Good  Recall:  Good  Fund of Knowledge: Good  Language: Good  Akathisia:  Negative  Handed:  Right  AIMS (if indicated): not done  Assets:  Communication Skills Desire for Improvement Financial Resources/Insurance Housing Intimacy Leisure Time Physical Health Resilience Social Support Talents/Skills Transportation Vocational/Educational  ADL's:  Intact  Cognition: WNL  Sleep:  Good   Screenings: PHQ2-9     [facility] 02/08/2015 [facility]  PHQ-2 Total Score  0       Assessment and Plan: Alyssa Mccarty is a 26 year old female with generalized anxiety disorder, ADHD currently well controlled with the current regimen. She has a specific phobia related to flying, and we discussed some strategies to deal more directly with this, given that  she [place] be going [place] this Thanksgiving.. She can use Vistaril as a backup if she continues to be anxious on the flight.  No acute safety issues and will follow up in 3 months.  1. GAD (generalized anxiety disorder)   2. Attention deficit hyperactivity disorder (ADHD), combined type    Continue Zoloft 100 mg daily Continue Adderall XR 30 mg daily; 3 months prescribed  Continue Adderall IR 15 mg daily as needed; 3 months prescribed RTC 3 months  Burnard LeighAlexander Arya Evolet Salminen, MD 05/30/2017, 3:33 PM

## 2017-08-29 ENCOUNTER — Encounter (HOSPITAL_COMMUNITY): Payer: Self-pay | Admitting: Psychiatry

## 2017-08-29 ENCOUNTER — Ambulatory Visit (INDEPENDENT_AMBULATORY_CARE_PROVIDER_SITE_OTHER): Payer: 59 | Admitting: Psychiatry

## 2017-08-29 DIAGNOSIS — F40298 Other specified phobia: Secondary | ICD-10-CM

## 2017-08-29 DIAGNOSIS — F902 Attention-deficit hyperactivity disorder, combined type: Secondary | ICD-10-CM | POA: Diagnosis not present

## 2017-08-29 DIAGNOSIS — F411 Generalized anxiety disorder: Secondary | ICD-10-CM | POA: Diagnosis not present

## 2017-08-29 MED ORDER — HYDROXYZINE HCL 10 MG PO TABS: 10.0000 mg | ORAL_TABLET | Freq: Every evening | ORAL | 0 refills | Status: DC | PRN

## 2017-08-29 MED ORDER — AMPHETAMINE-DEXTROAMPHETAMINE 15 MG PO TABS: 15.0000 mg | ORAL_TABLET | Freq: Every day | ORAL | 0 refills | Status: DC

## 2017-08-29 MED ORDER — SERTRALINE HCL 100 MG PO TABS: 100.0000 mg | ORAL_TABLET | Freq: Every day | ORAL | 1 refills | Status: DC

## 2017-08-29 MED ORDER — AMPHETAMINE-DEXTROAMPHET ER 30 MG PO CP24: 30.0000 mg | ORAL_CAPSULE | Freq: Every day | ORAL | 0 refills | Status: DC

## 2017-08-29 NOTE — Progress Notes (Signed)
BH MD/PA/NP OP Progress Note  08/29/2017 3:24 PM Alyssa Mccarty  MRN:  [id number]  Chief Complaint: med management  HPI: Alyssa Mccarty Mccarty reports that things are going fairly well with her, her relationship, her work, and she has an appointment scheduled for a therapist in [place] to do a specific type of exposure therapy with a virtual airplane flights.  She reports that she is not having any side effects or intolerance to the Adderall extended release and immediate release, her sex drive is fairly manageable with Zoloft, is sleeping well at night.  Denies any acute safety issues and agrees to follow-up in 12 weeks or sooner if needed.  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  Visit Diagnosis:    ICD-10-CM   1. GAD (generalized anxiety disorder) F41.1 sertraline (ZOLOFT) 100 MG tablet    hydrOXYzine (ATARAX/VISTARIL) 10 MG tablet  2. Attention deficit hyperactivity disorder (ADHD), combined type F90.2 sertraline (ZOLOFT) 100 MG tablet    amphetamine-dextroamphetamine (ADDERALL XR) 30 MG 24 hr capsule    amphetamine-dextroamphetamine (ADDERALL XR) 30 MG 24 hr capsule    amphetamine-dextroamphetamine (ADDERALL XR) 30 MG 24 hr capsule    amphetamine-dextroamphetamine (ADDERALL) 15 MG tablet    amphetamine-dextroamphetamine (ADDERALL) 15 MG tablet    amphetamine-dextroamphetamine (ADDERALL) 15 MG tablet  3. Specific phobia F40.298     Past Psychiatric History: See intake H&P for full details. Reviewed, with no updates at this time.   Past Medical History:  Past Medical History:  Diagnosis Date  . Anxiety   . Thyroid disease    No past surgical history on file.  Family Psychiatric History: See intake H&P for full details. Reviewed, with no updates at this time.   Family History:  Family History  Problem Relation Age of Onset  . Hyperlipidemia Father   . Hypertension Father    . Diabetes Paternal Grandfather     Social History:  Social History   Socioeconomic History  . Marital status: Single    Spouse name: Not on file  . Number of children: Not on file  . Years of education: Not on file  . Highest education level: Not on file  Social Needs  . Financial resource strain: Not on file  . Food insecurity - worry: Not on file  . Food insecurity - inability: Not on file  . Transportation needs - medical: Not on file  . Transportation needs - non-medical: Not on file  Occupational History  . Not on file  Tobacco Use  . Smoking status: Never Smoker  . Smokeless tobacco: Never Used  Substance and Sexual Activity  . Alcohol use: Yes    Comment: social use  . Drug use: No  . Sexual activity: Yes    Birth control/protection: Pill  Other Topics Concern  . Not on file  Social History Narrative  . Not on file    Allergies:  Allergies  Allergen Reactions  . Septra [Bactrim] Swelling    FACE SWELLING    Metabolic Disorder Labs: No results found for: HGBA1C, MPG No results found for: PROLACTIN No results found for: CHOL, TRIG, HDL, CHOLHDL, VLDL, LDLCALC Lab Results  Component Value Date   TSH 0.59 06/04/2014   TSH 1.118 12/01/2013    Therapeutic Level Labs: No results found for: LITHIUM No results found for: VALPROATE No components found for:  CBMZ  Current Medications: Current Outpatient Medications  Medication  Sig Dispense Refill  . [START ON 10/28/2017] amphetamine-dextroamphetamine (ADDERALL XR) 30 MG 24 hr capsule Take 1 capsule (30 mg total) by mouth daily. 30 capsule 0  . [START ON 09/28/2017] amphetamine-dextroamphetamine (ADDERALL XR) 30 MG 24 hr capsule Take 1 capsule (30 mg total) by mouth daily. 30 capsule 0  . amphetamine-dextroamphetamine (ADDERALL XR) 30 MG 24 hr capsule Take 1 capsule (30 mg total) by mouth daily. 30 capsule 0  . [START ON 10/28/2017] amphetamine-dextroamphetamine (ADDERALL) 15 MG tablet Take 1 tablet by mouth  daily. 30 tablet 0  . [START ON 09/28/2017] amphetamine-dextroamphetamine (ADDERALL) 15 MG tablet Take 1 tablet by mouth daily. 30 tablet 0  . amphetamine-dextroamphetamine (ADDERALL) 15 MG tablet Take 1 tablet by mouth daily. 30 tablet 0  . hydrOXYzine (ATARAX/VISTARIL) 10 MG tablet Take 1 tablet (10 mg total) by mouth at bedtime as needed. 90 tablet 0  . levonorgestrel-ethinyl estradiol (SEASONALE,INTROVALE,JOLESSA) 0.15-0.03 MG tablet     . levothyroxine (SYNTHROID, LEVOTHROID) 50 MCG tablet Take 50 mcg by mouth daily before breakfast.    . sertraline (ZOLOFT) 100 MG tablet Take 1 tablet (100 mg total) by mouth daily. 90 tablet 1   No current facility-administered medications for this visit.      Musculoskeletal: Strength & Muscle Tone: within normal limits Gait & Station: normal Patient leans: N/A  Psychiatric Specialty Exam: ROS  There were no vitals taken for this visit.There is no height or weight on file to calculate BMI.  General Appearance: Casual and Fairly Groomed  Eye Contact:  Fair  Speech:  Clear and Coherent  Volume:  Normal  Mood:  Euthymic  Affect:  Appropriate and Congruent  Thought Process:  Coherent, Goal Directed and Descriptions of Associations: Intact  Orientation:  Full (Time, Place, and Person)  Thought Content: Logical   Suicidal Thoughts:  No  Homicidal Thoughts:  No  Memory:  Immediate;   Fair  Judgement:  Fair  Insight:  Fair  Psychomotor Activity:  Normal  Concentration:  Concentration: Good  Recall:  Good  Fund of Knowledge: Good  Language: Good  Akathisia:  Negative  Handed:  Right  AIMS (if indicated): not done  Assets:  Communication Skills Desire for Improvement Financial Resources/Insurance Housing Leisure Time Physical Health Resilience Transportation Vocational/Educational  ADL's:  Intact  Cognition: WNL  Sleep:  Good   Screenings: PHQ2-9     Office Visit from 02/08/2015 in Primary Care at [facility] Total Score  0        Assessment and Plan: Alyssa Mccarty presents with good stability of her ADHD and generalized anxiety disorder with the medication regimen as below.  No concerns about substance abuse and no safety issues at this time.  We will follow-up in 12 weeks or sooner if needed.  1. GAD (generalized anxiety disorder)   2. Attention deficit hyperactivity disorder (ADHD), combined type   3. Specific phobia     Status of current problems: stable  Labs Ordered: No orders of the defined types were placed in this encounter.   Labs Reviewed: n/a  Collateral Obtained/Records Reviewed: n/a  Plan:  Continue Adderall XR 30 mg daily + Adderall immediate release 15 mg in the afternoon Continue Zoloft 100 mg daily Continue Vistaril 10 mg at night for sleep on an as-needed basis  I spent 15 minutes with the patient in direct face-to-face clinical care.  Greater than 50% of this time was spent in counseling and coordination of care with the patient.    Lyn Hollingshead  Mosetta AnisArya Eksir, MD 08/29/2017, 3:24 PM

## 2017-11-08 ENCOUNTER — Ambulatory Visit (HOSPITAL_COMMUNITY)
Admission: EM | Admit: 2017-11-08 | Discharge: 2017-11-08 | Disposition: A | Discharge status: Home / Self Care | Payer: 59 | Attending: Family Medicine | Admitting: Family Medicine

## 2017-11-08 ENCOUNTER — Encounter (HOSPITAL_COMMUNITY): Payer: Self-pay | Admitting: Family Medicine

## 2017-11-08 DIAGNOSIS — R110 Nausea: Secondary | ICD-10-CM

## 2017-11-08 DIAGNOSIS — R42 Dizziness and giddiness: Secondary | ICD-10-CM

## 2017-11-08 DIAGNOSIS — Z3202 Encounter for pregnancy test, result negative: Secondary | ICD-10-CM

## 2017-11-08 LAB — POCT URINALYSIS DIP (DEVICE)
BILIRUBIN URINE: NEGATIVE
GLUCOSE, UA: 100 mg/dL — AB
Hgb urine dipstick: NEGATIVE
KETONES UR: NEGATIVE mg/dL
Leukocytes, UA: NEGATIVE
NITRITE: NEGATIVE
Protein, ur: NEGATIVE mg/dL
Specific Gravity, Urine: 1.01 (ref 1.005–1.030)
Urobilinogen, UA: 0.2 mg/dL (ref 0.0–1.0)
pH: 7 (ref 5.0–8.0)

## 2017-11-08 LAB — POCT I-STAT, CHEM 8
BUN: 6 mg/dL (ref 6–20)
CALCIUM ION: 1.18 mmol/L (ref 1.15–1.40)
CHLORIDE: 102 mmol/L (ref 101–111)
Creatinine, Ser: 0.7 mg/dL (ref 0.44–1.00)
Glucose, Bld: 91 mg/dL (ref 65–99)
HCT: 43 % (ref 36.0–46.0)
Hemoglobin: 14.6 g/dL (ref 12.0–15.0)
Potassium: 3.7 mmol/L (ref 3.5–5.1)
SODIUM: 140 mmol/L (ref 135–145)
TCO2: 27 mmol/L (ref 22–32)

## 2017-11-08 LAB — POCT PREGNANCY, URINE: PREG TEST UR: NEGATIVE

## 2017-11-08 NOTE — ED Provider Notes (Signed)
[facility]   [id number] 11/08/17 Arrival Time: 1445   SUBJECTIVE:  Alyssa Mccarty is a 27 y.o. female who presents to the urgent care with complaint of dizziness this am. Reports that she felt like her Blood sugar was low and she ate 2 snacks and drank a power aid. Reports that she still felt dizzy after. She went to work and it got worse so she drank a few bottles of water. Her BP was 90/50 at work, she works at [employer]. She has nausea associated with it.    Patient describes her dizziness as lightheadedness, as if she were about to pass out. She had similar symptoms when she had a stomach bug years ago.  Last menstrual period was about 7 days ago.  Past Medical History:  Diagnosis Date  . Anxiety   . Thyroid disease    Family History  Problem Relation Age of Onset  . Hyperlipidemia Father   . Hypertension Father   . Diabetes Paternal Grandfather    Social History   Socioeconomic History  . Marital status: Single    Spouse name: Not on file  . Number of children: Not on file  . Years of education: Not on file  . Highest education level: Not on file  Social Needs  . Financial resource strain: Not on file  . Food insecurity - worry: Not on file  . Food insecurity - inability: Not on file  . Transportation needs - medical: Not on file  . Transportation needs - non-medical: Not on file  Occupational History  . Not on file  Tobacco Use  . Smoking status: Never Smoker  . Smokeless tobacco: Never Used  Substance and Sexual Activity  . Alcohol use: Yes    Comment: social use  . Drug use: No  . Sexual activity: Yes    Birth control/protection: Pill  Other Topics Concern  . Not on file  Social History Narrative  . Not on file   No outpatient medications have been marked as taking for the 11/08/17 encounter [facility](Hospital Encounter).   Allergies  Allergen Reactions  . Septra [Bactrim] Swelling    FACE SWELLING      ROS: As per HPI, remainder of ROS  negative.   OBJECTIVE:   Vitals:   11/08/17 1642  Resp: 18  Temp: 98 F (36.7 C)  SpO2: 98%     General appearance: alert; no distress Eyes: PERRL; EOMI; conjunctiva normal HENT: normocephalic; atraumatic; TMs normal, canal normal, external ears normal without trauma; nasal mucosa normal; oral mucosa normal Neck: supple Lungs: clear to auscultation bilaterally Heart: regular rate and rhythm Back: no CVA tenderness Extremities: no cyanosis or edema; symmetrical with no gross deformities Skin: warm and dry Neurologic: normal gait; grossly normal Psychological: alert and cooperative; normal mood and affect      Labs:  Results for orders placed or performed during the hospital encounter of 11/08/17  Pregnancy, urine POC  Result Value Ref Range   Preg Test, Ur NEGATIVE NEGATIVE  POCT urinalysis dip (device)  Result Value Ref Range   Glucose, UA 100 (A) NEGATIVE mg/dL   Bilirubin Urine NEGATIVE NEGATIVE   Ketones, ur NEGATIVE NEGATIVE mg/dL   Specific Gravity, Urine 1.010 1.005 - 1.030   Hgb urine dipstick NEGATIVE NEGATIVE   pH 7.0 5.0 - 8.0   Protein, ur NEGATIVE NEGATIVE mg/dL   Urobilinogen, UA 0.2 0.0 - 1.0 mg/dL   Nitrite NEGATIVE NEGATIVE   Leukocytes, UA NEGATIVE NEGATIVE  I-STAT, chem  8  Result Value Ref Range   Sodium 140 135 - 145 mmol/L   Potassium 3.7 3.5 - 5.1 mmol/L   Chloride 102 101 - 111 mmol/L   BUN 6 6 - 20 mg/dL   Creatinine, Ser 2.13 0.44 - 1.00 mg/dL   Glucose, Bld 91 65 - 99 mg/dL   Calcium, Ion 0.86 5.78 - 1.40 mmol/L   TCO2 27 22 - 32 mmol/L   Hemoglobin 14.6 12.0 - 15.0 g/dL   HCT 46.9 62.9 - 52.8 %    Labs Reviewed  POCT URINALYSIS DIP (DEVICE) - Abnormal; Notable for the following components:      Result Value   Glucose, UA 100 (*)    All other components within normal limits  POCT PREGNANCY, URINE  POCT I-STAT, CHEM 8    No results found.     ASSESSMENT & PLAN:  1. Lightheadedness     No orders of the defined  types were placed in this encounter.   Reviewed expectations re: course of current medical issues. Questions answered. Outlined signs and symptoms indicating need for more acute intervention. Patient verbalized understanding. After Visit Summary given.    Procedures:      Elvina Sidle, MD 11/08/17 1705

## 2017-11-08 NOTE — Discharge Instructions (Signed)
This appears to be a viral problem. Your labs are okay your blood pressure is now stable.  I recommend that you home and drink plenty of fluids and expect this to pass overnight.

## 2017-11-08 NOTE — ED Triage Notes (Addendum)
Pt here for dizziness this am. Reports that she felt like her Blood sugar was low and she ate 2 snacks and drank a power aid. Reports that she still felt dizzy after. She went to work and it got worse so she drank a few bottles of water. Her BP was 90/50 at work, she works at [employer]. She has nausea associated with it.

## 2017-11-27 ENCOUNTER — Encounter (HOSPITAL_COMMUNITY): Payer: Self-pay | Admitting: Psychiatry

## 2017-11-27 ENCOUNTER — Ambulatory Visit (HOSPITAL_COMMUNITY): Payer: 59 | Admitting: Psychiatry

## 2017-11-27 DIAGNOSIS — F902 Attention-deficit hyperactivity disorder, combined type: Secondary | ICD-10-CM

## 2017-11-27 DIAGNOSIS — F411 Generalized anxiety disorder: Secondary | ICD-10-CM | POA: Diagnosis not present

## 2017-11-27 MED ORDER — AMPHETAMINE-DEXTROAMPHET ER 30 MG PO CP24: 30.0000 mg | ORAL_CAPSULE | Freq: Every day | ORAL | 0 refills | Status: DC

## 2017-11-27 MED ORDER — AMPHETAMINE-DEXTROAMPHETAMINE 15 MG PO TABS: 15.0000 mg | ORAL_TABLET | Freq: Every day | ORAL | 0 refills | Status: DC

## 2017-11-27 MED ORDER — SERTRALINE HCL 100 MG PO TABS: 100.0000 mg | ORAL_TABLET | Freq: Every day | ORAL | 1 refills | Status: DC

## 2017-11-27 MED ORDER — HYDROXYZINE HCL 10 MG PO TABS: 10.0000 mg | ORAL_TABLET | Freq: Every evening | ORAL | 0 refills | Status: DC | PRN

## 2017-11-27 NOTE — Progress Notes (Signed)
BH MD/PA/NP OP Progress Note  11/27/2017 3:43 PM Alyssa Mccarty  MRN:  [id number]  Chief Complaint: Med check HPI: Alyssa Mccarty reports things are fairly stable with her anxiety and mood.  She had one episode of panic about 2 weeks ago in the context of nearly fainting from hypoglycemia.  Otherwise her anxiety and mood symptoms have been stable.  She wishes to continue the regimen as prescribed.  Sleeping well, attention and focus remain well managed with the current dosing of Adderall extended release and immediate release.  Spent some time discussing her strategies for coping with anxiety and life stressors including some stressors with her boyfriend, and she reports that she has an excellent social support system.  She will consider therapy if needed in the future.  Visit Diagnosis:    ICD-10-CM   1. Attention deficit hyperactivity disorder (ADHD), combined type F90.2 amphetamine-dextroamphetamine (ADDERALL) 15 MG tablet    amphetamine-dextroamphetamine (ADDERALL XR) 30 MG 24 hr capsule    amphetamine-dextroamphetamine (ADDERALL XR) 30 MG 24 hr capsule    amphetamine-dextroamphetamine (ADDERALL XR) 30 MG 24 hr capsule    amphetamine-dextroamphetamine (ADDERALL) 15 MG tablet    amphetamine-dextroamphetamine (ADDERALL) 15 MG tablet    sertraline (ZOLOFT) 100 MG tablet  2. GAD (generalized anxiety disorder) F41.1 sertraline (ZOLOFT) 100 MG tablet    hydrOXYzine (ATARAX/VISTARIL) 10 MG tablet    Past Psychiatric History: See intake H&P for full details. Reviewed, with no updates at this time.   Past Medical History:  Past Medical History:  Diagnosis Date  . Anxiety   . Thyroid disease    No past surgical history on file.  Family Psychiatric History: See intake H&P for full details. Reviewed, with no updates at this time.   Family History:  Family History  Problem Relation Age of Onset  . Hyperlipidemia Father   . Hypertension Father   . Diabetes Paternal Grandfather      Social History:  Social History   Socioeconomic History  . Marital status: Single    Spouse name: Not on file  . Number of children: Not on file  . Years of education: Not on file  . Highest education level: Not on file  Social Needs  . Financial resource strain: Not on file  . Food insecurity - worry: Not on file  . Food insecurity - inability: Not on file  . Transportation needs - medical: Not on file  . Transportation needs - non-medical: Not on file  Occupational History  . Not on file  Tobacco Use  . Smoking status: Never Smoker  . Smokeless tobacco: Never Used  Substance and Sexual Activity  . Alcohol use: Yes    Comment: social use  . Drug use: No  . Sexual activity: Yes    Birth control/protection: Pill  Other Topics Concern  . Not on file  Social History Narrative  . Not on file    Allergies:  Allergies  Allergen Reactions  . Septra [Bactrim] Swelling    FACE SWELLING    Metabolic Disorder Labs: No results found for: HGBA1C, MPG No results found for: PROLACTIN No results found for: CHOL, TRIG, HDL, CHOLHDL, VLDL, LDLCALC Lab Results  Component Value Date   TSH 0.59 06/04/2014   TSH 1.118 12/01/2013    Therapeutic Level Labs: No results found for: LITHIUM No results found for: VALPROATE No components found for:  CBMZ  Current Medications: Current Outpatient Medications  Medication Sig Dispense Refill  . [START ON 01/26/2018] amphetamine-dextroamphetamine (ADDERALL XR) 30  MG 24 hr capsule Take 1 capsule (30 mg total) by mouth daily. 30 capsule 0  . [START ON 12/27/2017] amphetamine-dextroamphetamine (ADDERALL XR) 30 MG 24 hr capsule Take 1 capsule (30 mg total) by mouth daily. 30 capsule 0  . amphetamine-dextroamphetamine (ADDERALL XR) 30 MG 24 hr capsule Take 1 capsule (30 mg total) by mouth daily. 30 capsule 0  . [START ON 01/26/2018] amphetamine-dextroamphetamine (ADDERALL) 15 MG tablet Take 1 tablet by mouth daily. 30 tablet 0  . [START ON  12/27/2017] amphetamine-dextroamphetamine (ADDERALL) 15 MG tablet Take 1 tablet by mouth daily. 30 tablet 0  . amphetamine-dextroamphetamine (ADDERALL) 15 MG tablet Take 1 tablet by mouth daily. 30 tablet 0  . hydrOXYzine (ATARAX/VISTARIL) 10 MG tablet Take 1 tablet (10 mg total) by mouth at bedtime as needed. 90 tablet 0  . levonorgestrel-ethinyl estradiol (SEASONALE,INTROVALE,JOLESSA) 0.15-0.03 MG tablet     . levothyroxine (SYNTHROID, LEVOTHROID) 50 MCG tablet Take 50 mcg by mouth daily before breakfast.    . sertraline (ZOLOFT) 100 MG tablet Take 1 tablet (100 mg total) by mouth daily. 90 tablet 1   No current facility-administered medications for this visit.      Musculoskeletal: Strength & Muscle Tone: within normal limits Gait & Station: normal Patient leans: N/A  Psychiatric Specialty Exam: ROS  Blood pressure 110/68, pulse 95, height 5' 6.5" (1.689 m), weight 148 lb 3.2 oz (67.2 kg), last menstrual period 11/03/2017.Body mass index is 23.56 kg/m.  General Appearance: Casual and Well Groomed  Eye Contact:  Good  Speech:  Clear and Coherent and Normal Rate  Volume:  Normal  Mood:  Anxious and Euthymic  Affect:  Congruent  Thought Process:  Goal Directed and Descriptions of Associations: Intact  Orientation:  Full (Time, Place, and Person)  Thought Content: Logical   Suicidal Thoughts:  No  Homicidal Thoughts:  No  Memory:  Immediate;   Good  Judgement:  Fair  Insight:  Fair  Psychomotor Activity:  Normal  Concentration:  Concentration: Good  Recall:  Good  Fund of Knowledge: Good  Language: Good  Akathisia:  Negative  Handed:  Right  AIMS (if indicated): not done  Assets:  Communication Skills Desire for Improvement Financial Resources/Insurance Housing Intimacy Transportation Vocational/Educational  ADL's:  Intact  Cognition: WNL  Sleep:  Good   Screenings: PHQ2-9     [facility]  PHQ-2 Total Score  0        Assessment and Plan:  [name] presents with relative stability of generalized anxiety and ADHD symptoms.  Has had some external stressors contributing to some appropriate and normal anxiety response.  We will continue the medication regimen as below and follow-up in 3 months.  1. Attention deficit hyperactivity disorder (ADHD), combined type   2. GAD (generalized anxiety disorder)     Status of current problems: stable  Labs Ordered: No orders of the defined types were placed in this encounter.   Labs Reviewed: n/a  Collateral Obtained/Records Reviewed: n/a  Plan:  Continue Zoloft, Adderall XR, and Adderall IR as prescribed RTC 3 months  I spent 20 minutes with the patient in direct face-to-face clinical care.  Greater than 50% of this time was spent in counseling and coordination of care with the patient.    Burnard LeighAlexander Arya Eksir, MD 11/27/2017, 3:43 PM

## 2018-02-27 ENCOUNTER — Ambulatory Visit (HOSPITAL_COMMUNITY): Payer: 59 | Admitting: Psychiatry

## 2018-02-27 VITALS — BP 118/68 | HR 102 | Ht 67.0 in | Wt 148.0 lb

## 2018-02-27 DIAGNOSIS — Z79890 Hormone replacement therapy (postmenopausal): Secondary | ICD-10-CM | POA: Diagnosis not present

## 2018-02-27 DIAGNOSIS — Z569 Unspecified problems related to employment: Secondary | ICD-10-CM | POA: Diagnosis not present

## 2018-02-27 DIAGNOSIS — Z79899 Other long term (current) drug therapy: Secondary | ICD-10-CM | POA: Diagnosis not present

## 2018-02-27 DIAGNOSIS — F902 Attention-deficit hyperactivity disorder, combined type: Secondary | ICD-10-CM | POA: Diagnosis not present

## 2018-02-27 DIAGNOSIS — E039 Hypothyroidism, unspecified: Secondary | ICD-10-CM

## 2018-02-27 DIAGNOSIS — F411 Generalized anxiety disorder: Secondary | ICD-10-CM

## 2018-02-27 MED ORDER — HYDROXYZINE HCL 10 MG PO TABS: 10.0000 mg | ORAL_TABLET | Freq: Every evening | ORAL | 0 refills | Status: DC | PRN

## 2018-02-27 MED ORDER — AMPHETAMINE-DEXTROAMPHETAMINE 15 MG PO TABS: 15.0000 mg | ORAL_TABLET | Freq: Every day | ORAL | 0 refills | Status: DC

## 2018-02-27 MED ORDER — AMPHETAMINE-DEXTROAMPHET ER 30 MG PO CP24: 30.0000 mg | ORAL_CAPSULE | Freq: Every day | ORAL | 0 refills | Status: DC

## 2018-02-27 MED ORDER — SERTRALINE HCL 100 MG PO TABS: 100.0000 mg | ORAL_TABLET | Freq: Every day | ORAL | 1 refills | Status: DC

## 2018-02-27 NOTE — Progress Notes (Signed)
BH MD/PA/NP OP Progress Note  02/27/2018 2:01 PM Yaqueline Gutter  MRN:  [id number]  Chief Complaint: Med management HPI: Kenise Bergquist presents for medication follow-up, and reports that her anxiety and ADHD symptoms are generally stable.  She reports that she has had some stressors at work and she is looking for a new job, but nothing pressing that she needs to find a new job immediately.  She feels like her and her boyfriend are doing better in terms of their communication and things seem to be improving with her relationship.  She is sleeping well at night, and uses the Vistaril sparingly.  No side effects from the Zoloft and Adderall.  We agreed to check her TSH free T4, and CBC today for routine medication follow-up and given that she is on Synthroid for hypothyroidism.  No acute safety issues and we will follow-up in 3 months.  Disclosed to patient that this Clinical research associate is leaving this practice at the end of August 2019, and patients always has the right to choose their provider. Reassured patient that office will work to provide smooth transition of care whether they wish to remain at this office, or to continue with this provider, or seek alternative care options in community.  They expressed understanding.   Visit Diagnosis:    ICD-10-CM   1. Attention deficit hyperactivity disorder (ADHD), combined type F90.2 amphetamine-dextroamphetamine (ADDERALL) 15 MG tablet    amphetamine-dextroamphetamine (ADDERALL XR) 30 MG 24 hr capsule    amphetamine-dextroamphetamine (ADDERALL XR) 30 MG 24 hr capsule    amphetamine-dextroamphetamine (ADDERALL XR) 30 MG 24 hr capsule    amphetamine-dextroamphetamine (ADDERALL) 15 MG tablet    amphetamine-dextroamphetamine (ADDERALL) 15 MG tablet    sertraline (ZOLOFT) 100 MG tablet  2. GAD (generalized anxiety disorder) F41.1 sertraline (ZOLOFT) 100 MG tablet    hydrOXYzine (ATARAX/VISTARIL) 10 MG tablet    Past Psychiatric History: See intake H&P for full details.  Reviewed, with no updates at this time.   Past Medical History:  Past Medical History:  Diagnosis Date  . Anxiety   . Thyroid disease    No past surgical history on file.  Family Psychiatric History: See intake H&P for full details. Reviewed, with no updates at this time.   Family History:  Family History  Problem Relation Age of Onset  . Hyperlipidemia Father   . Hypertension Father   . Diabetes Paternal Grandfather     Social History:  Social History   Socioeconomic History  . Marital status: Single    Spouse name: Not on file  . Number of children: Not on file  . Years of education: Not on file  . Highest education level: Not on file  Occupational History  . Not on file  Social Needs  . Financial resource strain: Not on file  . Food insecurity:    Worry: Not on file    Inability: Not on file  . Transportation needs:    Medical: Not on file    Non-medical: Not on file  Tobacco Use  . Smoking status: Never Smoker  . Smokeless tobacco: Never Used  Substance and Sexual Activity  . Alcohol use: Yes    Comment: social use  . Drug use: No  . Sexual activity: Yes    Birth control/protection: Pill  Lifestyle  . Physical activity:    Days per week: Not on file    Minutes per session: Not on file  . Stress: Not on file  Relationships  . Social connections:  Talks on phone: Not on file    Gets together: Not on file    Attends religious service: Not on file    Active member of club or organization: Not on file    Attends meetings of clubs or organizations: Not on file    Relationship status: Not on file  Other Topics Concern  . Not on file  Social History Narrative  . Not on file    Allergies:  Allergies  Allergen Reactions  . Septra [Bactrim] Swelling    FACE SWELLING    Metabolic Disorder Labs: No results found for: HGBA1C, MPG No results found for: PROLACTIN No results found for: CHOL, TRIG, HDL, CHOLHDL, VLDL, LDLCALC Lab Results  Component  Value Date   TSH 0.59 06/04/2014   TSH 1.118 12/01/2013    Therapeutic Level Labs: No results found for: LITHIUM No results found for: VALPROATE No components found for:  CBMZ  Current Medications: Current Outpatient Medications  Medication Sig Dispense Refill  . [START ON 04/27/2018] amphetamine-dextroamphetamine (ADDERALL XR) 30 MG 24 hr capsule Take 1 capsule (30 mg total) by mouth daily. 30 capsule 0  . [START ON 03/28/2018] amphetamine-dextroamphetamine (ADDERALL XR) 30 MG 24 hr capsule Take 1 capsule (30 mg total) by mouth daily. 30 capsule 0  . amphetamine-dextroamphetamine (ADDERALL XR) 30 MG 24 hr capsule Take 1 capsule (30 mg total) by mouth daily. 30 capsule 0  . [START ON 04/27/2018] amphetamine-dextroamphetamine (ADDERALL) 15 MG tablet Take 1 tablet by mouth daily. 30 tablet 0  . [START ON 03/28/2018] amphetamine-dextroamphetamine (ADDERALL) 15 MG tablet Take 1 tablet by mouth daily. 30 tablet 0  . amphetamine-dextroamphetamine (ADDERALL) 15 MG tablet Take 1 tablet by mouth daily. 30 tablet 0  . hydrOXYzine (ATARAX/VISTARIL) 10 MG tablet Take 1 tablet (10 mg total) by mouth at bedtime as needed. 90 tablet 0  . levonorgestrel-ethinyl estradiol (SEASONALE,INTROVALE,JOLESSA) 0.15-0.03 MG tablet     . levothyroxine (SYNTHROID, LEVOTHROID) 50 MCG tablet Take 50 mcg by mouth daily before breakfast.    . sertraline (ZOLOFT) 100 MG tablet Take 1 tablet (100 mg total) by mouth daily. 90 tablet 1   No current facility-administered medications for this visit.      Musculoskeletal: Strength & Muscle Tone: within normal limits Gait & Station: normal Patient leans: N/A  Psychiatric Specialty Exam: ROS  Blood pressure 118/68, pulse (!) 102, height 5\' 7"  (1.702 m), weight 148 lb (67.1 kg).Body mass index is 23.18 kg/m.  General Appearance: Casual and Well Groomed  Eye Contact:  Good  Speech:  Clear and Coherent and Normal Rate  Volume:  Normal  Mood:  Euthymic  Affect:  Appropriate  and Congruent  Thought Process:  Goal Directed and Descriptions of Associations: Intact  Orientation:  Full (Time, Place, and Person)  Thought Content: Logical   Suicidal Thoughts:  No  Homicidal Thoughts:  No  Memory:  Immediate;   Fair  Judgement:  Good  Insight:  Good  Psychomotor Activity:  Normal  Concentration:  Concentration: Good  Recall:  Good  Fund of Knowledge: Good  Language: Good  Akathisia:  Negative  Handed:  Right  AIMS (if indicated): not done  Assets:  Communication Skills Desire for Improvement Financial Resources/Insurance Housing Intimacy Leisure Time Physical Health Resilience Social Support Talents/Skills Transportation Vocational/Educational  ADL's:  Intact  Cognition: WNL  Sleep:  Good   Screenings: PHQ2-9     Office Visit from 02/08/2015 in Primary Care at [facility] Total Score  0  Assessment and Plan:  Adelina MingsKelsey Budde presents with stable mood and ADHD symptoms, continues to work on issues of communication and spending time with her fianc to strengthen the relationship.  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  We will follow-up in 3 months or sooner if needed.  1. Attention deficit hyperactivity disorder (ADHD), combined type   2. GAD (generalized anxiety disorder)     Status of current problems: stable  Labs Ordered: No orders of the defined types were placed in this encounter.   Labs Reviewed: n/a  Collateral Obtained/Records Reviewed: n/a  Plan:  Continue Zoloft 100 mg daily Continue Adderall XR 30 mg and Adderall 15 mg immediate release Return to clinic in 3 months  Burnard LeighAlexander Arya Ajna Moors, MD 02/27/2018, 2:01 PM

## 2018-02-28 ENCOUNTER — Encounter (INDEPENDENT_AMBULATORY_CARE_PROVIDER_SITE_OTHER): Payer: Self-pay

## 2018-02-28 LAB — CBC WITH DIFFERENTIAL/PLATELET
BASOS ABS: 0 10*3/uL (ref 0.0–0.2)
Basos: 0 %
EOS (ABSOLUTE): 0.1 10*3/uL (ref 0.0–0.4)
EOS: 2 %
HEMATOCRIT: 38.8 % (ref 34.0–46.6)
Hemoglobin: 13 g/dL (ref 11.1–15.9)
IMMATURE GRANULOCYTES: 0 %
Immature Grans (Abs): 0 10*3/uL (ref 0.0–0.1)
LYMPHS ABS: 1.5 10*3/uL (ref 0.7–3.1)
Lymphs: 24 %
MCH: 27.8 pg (ref 26.6–33.0)
MCHC: 33.5 g/dL (ref 31.5–35.7)
MCV: 83 fL (ref 79–97)
MONOCYTES: 10 %
MONOS ABS: 0.6 10*3/uL (ref 0.1–0.9)
Neutrophils Absolute: 4 10*3/uL (ref 1.4–7.0)
Neutrophils: 64 %
PLATELETS: 232 10*3/uL (ref 150–450)
RBC: 4.67 x10E6/uL (ref 3.77–5.28)
RDW: 15 % (ref 12.3–15.4)
WBC: 6.3 10*3/uL (ref 3.4–10.8)

## 2018-02-28 LAB — SPECIMEN STATUS REPORT

## 2018-02-28 LAB — TSH+FREE T4
Free T4: 1 ng/dL (ref 0.82–1.77)
TSH: 2.15 u[IU]/mL (ref 0.450–4.500)

## 2018-08-31 ENCOUNTER — Emergency Department (HOSPITAL_COMMUNITY)
Admission: EM | Admit: 2018-08-31 | Discharge: 2018-08-31 | Disposition: A | Discharge status: Home / Self Care | Payer: 59 | Attending: Emergency Medicine | Admitting: Emergency Medicine

## 2018-08-31 ENCOUNTER — Encounter (HOSPITAL_COMMUNITY): Payer: Self-pay

## 2018-08-31 DIAGNOSIS — Z5321 Procedure and treatment not carried out due to patient leaving prior to being seen by health care provider: Secondary | ICD-10-CM | POA: Insufficient documentation

## 2018-08-31 DIAGNOSIS — R42 Dizziness and giddiness: Secondary | ICD-10-CM | POA: Insufficient documentation

## 2018-08-31 NOTE — ED Triage Notes (Signed)
Patient states an hour ago she took all of her medication plus a new medication (50mg  Naltrexone). Patient c/o dizziness/tingling and feels"high". This caused patient to become anxious.  Patient denies swelling or trouble breathing.   Patient able to speak in complete sentences in triage.   A/Ox4 Ambulatory in triage.

## 2018-12-17 ENCOUNTER — Ambulatory Visit (HOSPITAL_COMMUNITY)
Admission: RE | Admit: 2018-12-17 | Discharge: 2018-12-17 | Disposition: A | Discharge status: Home / Self Care | Payer: No Typology Code available for payment source | Attending: Psychiatry | Admitting: Psychiatry

## 2018-12-17 DIAGNOSIS — F411 Generalized anxiety disorder: Secondary | ICD-10-CM | POA: Insufficient documentation

## 2018-12-17 DIAGNOSIS — R197 Diarrhea, unspecified: Secondary | ICD-10-CM | POA: Insufficient documentation

## 2018-12-17 DIAGNOSIS — R109 Unspecified abdominal pain: Secondary | ICD-10-CM | POA: Diagnosis not present

## 2018-12-17 DIAGNOSIS — R112 Nausea with vomiting, unspecified: Secondary | ICD-10-CM | POA: Diagnosis not present

## 2018-12-17 NOTE — H&P (Signed)
Behavioral Health Medical Screening Exam  Alyssa Mccarty is an 28 y.o. female patient presents as walk in at [facility] with complaints of worse anxiety related to N/V and unable to keep food down.  Denies suicide/self-harm/homicidal ideation, psychosis, and paranoia   Total Time spent with patient: 30 minutes  Psychiatric Specialty Exam: Physical Exam  Vitals reviewed. Constitutional: She is oriented to person, place, and time.  Neck: Normal range of motion. Neck supple.  Respiratory: Effort normal.  Musculoskeletal: Normal range of motion.  Neurological: She is alert and oriented to person, place, and time.  Skin: Skin is warm and dry.  Psychiatric: Her speech is normal and behavior is normal. Judgment and thought content normal. Her mood appears anxious. Cognition and memory are normal.    Review of Systems  Gastrointestinal: Positive for abdominal pain, diarrhea, nausea and vomiting.  Genitourinary: Negative.   Psychiatric/Behavioral: Negative for depression, hallucinations, memory loss, substance abuse and suicidal ideas. The patient is nervous/anxious. The patient does not have insomnia.   All other systems reviewed and are negative.   Blood pressure 115/81, pulse (!) 118, temperature 98.2 F (36.8 C), resp. rate 18, SpO2 100 %.There is no height or weight on file to calculate BMI.  General Appearance: Casual and Neat  Eye Contact:  Good  Speech:  Clear and Coherent and Normal Rate  Volume:  Normal  Mood:  Anxious  Affect:  Appropriate and Congruent  Thought Process:  Coherent and Goal Directed  Orientation:  Full (Time, Place, and Person)  Thought Content:  WDL and Logical  Suicidal Thoughts:  No  Homicidal Thoughts:  No  Memory:  Immediate;   Good Recent;   Good Remote;   Good  Judgement:  Intact  Insight:  Present  Psychomotor Activity:  Normal  Concentration: Concentration: Good and Attention Span: Good  Recall:  Good  Fund of Knowledge:Good  Language: Good   Akathisia:  No  Handed:  Right  AIMS (if indicated):     Assets:  Communication Skills Desire for Improvement Housing Physical Health Social Support Transportation  Sleep:       Musculoskeletal: Strength & Muscle Tone: within normal limits Gait & Station: normal Patient leans: N/A  Blood pressure 115/81, pulse (!) 118, temperature 98.2 F (36.8 C), resp. rate 18, SpO2 100 %.  Recommendations:  Follow up with PCP or ED if worsening/dehydration.  Follow up with psychiatric provider also  Disposition: No evidence of imminent risk to self or others at present.   Patient does not meet criteria for psychiatric inpatient admission. Supportive therapy provided about ongoing stressors. Discussed crisis plan, support from social network, calling 911, coming to the Emergency Department, and calling Suicide Hotline.  Based on my evaluation the patient does not appear to have an emergency medical condition.  Shuvon Rankin, NP 12/17/2018, 1:55 PM

## 2018-12-17 NOTE — BH Assessment (Signed)
Assessment Note  Alyssa Mccarty is a 28 y.o. female walk-in at [facility] experiencing increased anxiety.  Pt stated "last Thursday I had a panic attack that lasted 3 hours. Ever since, my heart has been racing and I have been feeling helpless.  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  Pt reports that she contacted [facility] to schedule an appointment but wasn't able to do so and came to [facility] for immediate treatment.   Pt reports seeing Dr Gaspar Skeeters every 3 months for medication management. Pt denies a history of inpatient MH treatment. Pt reports drinking 2 glasses of wines daily with the last use being 2 weeks ago. Pt denies SI/HI/A/V-hallucination.    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  Pt reports working 1 day a week at an [employer].  Pt denies having a history of physical, sexual, and verbal abuse.    Patient was wearing gym clothes and appeared appropriately groomed.  Pt was alert throughout the assessment.  Patient made good eye contact and had normal psychomotor activity.  Patient spoke in a normal voice without pressured speech.  Pt expressed feeling anxious.  Pt's affect appeared dysphoric and congruent with stated mood. Pt's thought process was coherent and logical.  Pt presented with good insight and judgement.  Pt did not appear to be responding to internal stimuli.  Pt was able to contract for safety.  Disposition: [facility] discussed case with BH provider, Assunta Found, NP who recommended outpatient treatment and community resources.  Diagnosis: Generalized Anxiety Disorder  Past Medical History:  Past Medical History:  Diagnosis Date  . Anxiety   . Thyroid disease     No past surgical history on file.  Family History:  Family History  Problem Relation Age of Onset  . Hyperlipidemia Father    . Hypertension Father   . Diabetes Paternal Grandfather     Social History:  reports that she has never smoked. She has never used smokeless tobacco. She reports current alcohol use. She reports that she does not use drugs.  Additional Social History:  Alcohol / Drug Use Pain Medications: See MARs Prescriptions: See MARs Over the Counter: See MARs History of alcohol / drug use?: Yes Longest period of sobriety (when/how long): unknown Substance #1 Name of Substance 1: Alcohol 1 - Age of First Use: unknown 1 - Amount (size/oz): 2 glasses per day 1 - Frequency: daily 1 - Duration: ongoing 1 - Last Use / Amount: 2 weeks ago  CIWA: CIWA-Ar BP: 115/81 Pulse Rate: (!) 118 COWS:    Allergies:  Allergies  Allergen Reactions  . Septra [Bactrim] Swelling    FACE SWELLING    Home Medications: (Not in a hospital admission)   OB/GYN Status:  No LMP recorded. (Menstrual status: Oral contraceptives).  General Assessment Data Location of Assessment: [facility] Assessment Services TTS Assessment: In system Is this a Tele or Face-to-Face Assessment?: Face-to-Face Is this an Initial Assessment or a Re-assessment for this encounter?: Initial Assessment Patient Accompanied by:: N/A Language Other than English: No Living Arrangements: Other (Comment)(Home with boyfriend) What gender do you identify as?: Female Marital status: Long term relationship Maiden name: Met[place] Pregnancy Status: Unknown Living Arrangements: Spouse/significant other(Boyfriend) Can pt return to current living arrangement?: Yes Admission Status: Voluntary Is patient capable of signing voluntary admission?: Yes Referral Source:  Self/Family/Friend  Medical Screening Exam [facility] Walk-in ONLY) Medical Exam completed: Yes  Crisis Care Plan Living Arrangements: Spouse/significant other(Boyfriend) Name of Psychiatrist: Dr Gaspar Skeeters Name of Therapist: None  Education Status Is patient currently in school?: Yes Current  Grade: Nursing School Highest grade of school patient has completed: Graduated High School  Risk to self with the past 6 months Suicidal Ideation: No Has patient been a risk to self within the past 6 months prior to admission? : No Suicidal Intent: No Has patient had any suicidal intent within the past 6 months prior to admission? : No Is patient at risk for suicide?: No Suicidal Plan?: No Has patient had any suicidal plan within the past 6 months prior to admission? : No Access to Means: No What has been your use of drugs/alcohol within the last 12 months?: Alcohol Previous Attempts/Gestures: No Triggers for Past Attempts: None known Intentional Self Injurious Behavior: None Family Suicide History: No Recent stressful life event(s): Other (Comment)(Changes in class format) Persecutory voices/beliefs?: No Depression: No Depression Symptoms: Tearfulness, Fatigue, Loss of interest in usual pleasures Substance abuse history and/or treatment for substance abuse?: No Suicide prevention information given to non-admitted patients: Not applicable  Risk to Others within the past 6 months Homicidal Ideation: No Does patient have any lifetime risk of violence toward others beyond the six months prior to admission? : No Thoughts of Harm to Others: No Current Homicidal Intent: No Current Homicidal Plan: No Access to Homicidal Means: No History of harm to others?: No Assessment of Violence: None Noted Does patient have access to weapons?: No Criminal Charges Pending?: No Does patient have a court date: No Is patient on probation?: No  Psychosis Hallucinations: None noted Delusions: None noted  Mental Status Report Appearance/Hygiene: Unremarkable Eye Contact: Good Motor Activity: Freedom of movement Speech: Logical/coherent Level of Consciousness: Alert, Quiet/awake Mood: Anxious, Apprehensive, Fearful, Preoccupied Affect: Anxious, Apprehensive, Appropriate to circumstance,  Fearful, Preoccupied Anxiety Level: Moderate Thought Processes: Coherent, Relevant Judgement: Partial Orientation: Person, Place, Time, Appropriate for developmental age Obsessive Compulsive Thoughts/Behaviors: None  Cognitive Functioning Concentration: Normal Memory: Recent Intact, Remote Intact Is patient IDD: No Insight: Fair Impulse Control: Fair Appetite: Poor Have you had any weight changes? : No Change Sleep: Increased Total Hours of Sleep: 10 Vegetative Symptoms: Staying in bed  ADLScreening [facility] Assessment Services) Patient's cognitive ability adequate to safely complete daily activities?: Yes Patient able to express need for assistance with ADLs?: Yes Independently performs ADLs?: Yes (appropriate for developmental age)  Prior Inpatient Therapy Prior Inpatient Therapy: No  Prior Outpatient Therapy Prior Outpatient Therapy: Yes Prior Therapy Dates: ongoing Prior Therapy Facilty/Provider(s): Dr. Gaspar Skeeters Reason for Treatment: Medication management Does patient have an ACCT team?: No Does patient have Intensive In-House Services?  : No Does patient have [facility] services? : Unknown Does patient have P4CC services?: No  ADL Screening (condition at time of admission) Patient's cognitive ability adequate to safely complete daily activities?: Yes Is the patient deaf or have difficulty hearing?: No Does the patient have difficulty seeing, even when wearing glasses/contacts?: No Does the patient have difficulty concentrating, remembering, or making decisions?: No Patient able to express need for assistance with ADLs?: Yes Does the patient have difficulty dressing or bathing?: No Independently performs ADLs?: Yes (appropriate for developmental age) Does the patient have difficulty walking or climbing stairs?: No Weakness of Legs: None Weakness of Arms/Hands: None  Home Assistive Devices/Equipment Home Assistive Devices/Equipment: None    Abuse/Neglect Assessment  (Assessment to be complete while patient  is alone) Abuse/Neglect Assessment Can Be Completed: Yes Physical Abuse: Denies Verbal Abuse: Denies Sexual Abuse: Denies Exploitation of patient/patient's resources: Denies Self-Neglect: Denies     Merchant navy officer (For Healthcare) Does Patient Have a Medical Advance Directive?: No Would patient like information on creating a medical advance directive?: No - Patient declined Nutrition Screen- [facility] Adult/WL/AP Patient's home diet: NPO        Disposition: [facility] discussed case with BH provider, Assunta Found, NP who recommended outpatient treatment and community resources.  Disposition Initial Assessment Completed for this Encounter: Yes Disposition of Patient: Discharge Patient refused recommended treatment: No Mode of transportation if patient is discharged/movement?: Car Patient referred to: Outpatient clinic referral  On Site Evaluation by:   Reviewed with Physician:    Tyron Russell, MS, [facility], NCC 12/17/2018 2:21 PM

## 2019-06-26 ENCOUNTER — Emergency Department (HOSPITAL_COMMUNITY)
Admission: EM | Admit: 2019-06-26 | Discharge: 2019-06-26 | Disposition: A | Discharge status: Home / Self Care | Payer: Self-pay | Attending: Emergency Medicine | Admitting: Emergency Medicine

## 2019-06-26 ENCOUNTER — Other Ambulatory Visit: Payer: Self-pay

## 2019-06-26 ENCOUNTER — Emergency Department (HOSPITAL_COMMUNITY): Payer: Self-pay

## 2019-06-26 ENCOUNTER — Encounter (HOSPITAL_COMMUNITY): Payer: Self-pay

## 2019-06-26 DIAGNOSIS — R42 Dizziness and giddiness: Secondary | ICD-10-CM | POA: Insufficient documentation

## 2019-06-26 DIAGNOSIS — F419 Anxiety disorder, unspecified: Secondary | ICD-10-CM | POA: Insufficient documentation

## 2019-06-26 DIAGNOSIS — E039 Hypothyroidism, unspecified: Secondary | ICD-10-CM | POA: Insufficient documentation

## 2019-06-26 DIAGNOSIS — Z79899 Other long term (current) drug therapy: Secondary | ICD-10-CM | POA: Insufficient documentation

## 2019-06-26 DIAGNOSIS — R519 Headache, unspecified: Secondary | ICD-10-CM | POA: Insufficient documentation

## 2019-06-26 LAB — BASIC METABOLIC PANEL
Anion gap: 8 (ref 5–15)
BUN: 12 mg/dL (ref 6–20)
CO2: 25 mmol/L (ref 22–32)
Calcium: 9.8 mg/dL (ref 8.9–10.3)
Chloride: 106 mmol/L (ref 98–111)
Creatinine, Ser: 0.73 mg/dL (ref 0.44–1.00)
GFR calc Af Amer: >60 mL/min (ref >60)
GFR calc non Af Amer: >60 mL/min (ref >60)
Glucose, Bld: 122 mg/dL — ABNORMAL HIGH (ref 70–99)
Potassium: 3.6 mmol/L (ref 3.5–5.1)
Sodium: 139 mmol/L (ref 135–145)

## 2019-06-26 LAB — URINALYSIS, ROUTINE W REFLEX MICROSCOPIC
Bilirubin Urine: NEGATIVE
Glucose, UA: NEGATIVE mg/dL
Hgb urine dipstick: NEGATIVE
Ketones, ur: NEGATIVE mg/dL
Leukocytes,Ua: NEGATIVE
Nitrite: NEGATIVE
Protein, ur: NEGATIVE mg/dL
Specific Gravity, Urine: 1.015 (ref 1.005–1.030)
pH: 8 (ref 5.0–8.0)

## 2019-06-26 LAB — CBC
HCT: 36.4 % (ref 36.0–46.0)
Hemoglobin: 11.1 g/dL — ABNORMAL LOW (ref 12.0–15.0)
MCH: 23.5 pg — ABNORMAL LOW (ref 26.0–34.0)
MCHC: 30.5 g/dL (ref 30.0–36.0)
MCV: 77.1 fL — ABNORMAL LOW (ref 80.0–100.0)
Platelets: 333 10*3/uL (ref 150–400)
RBC: 4.72 MIL/uL (ref 3.87–5.11)
RDW: 15.6 % — ABNORMAL HIGH (ref 11.5–15.5)
WBC: 5.5 10*3/uL (ref 4.0–10.5)
nRBC: 0 % (ref 0.0–0.2)

## 2019-06-26 LAB — I-STAT BETA HCG BLOOD, ED (MC, WL, AP ONLY): I-stat hCG, quantitative: <5 m[IU]/mL (ref <5)

## 2019-06-26 LAB — CBG MONITORING, ED: Glucose-Capillary: 115 mg/dL — ABNORMAL HIGH (ref 70–99)

## 2019-06-26 MED ORDER — SODIUM CHLORIDE 0.9% FLUSH: 3.0000 mL | Freq: Once | INTRAVENOUS | Status: DC

## 2019-06-26 NOTE — ED Provider Notes (Signed)
[facility] DEPT Provider Note   CSN: [id number] Arrival date & time: 06/26/19  1311     History   Chief Complaint Chief Complaint  Patient presents with  . Dizziness    HPI Alyssa Mccarty is a 28 y.o. female.     Patient is a 28 year old female with past medical history of Hashimoto's thyroiditis, anxiety.  She presents today for evaluation of dizziness.  She reports episodes of lightheadedness that have been occurring over the past week.  This occurs at random with both rest and exertion.  She experiences episodes of palpitations and feeling very anxious.  Patient does have a history of anxiety, however this feels somewhat different.  She also reports headaches during this period of time which are mostly right-sided.  She denies any visual disturbances, fevers, or chills.  The history is provided by the patient.  Dizziness Quality:  Lightheadedness Severity:  Moderate Onset quality:  Sudden Duration:  1 week Timing:  Intermittent Progression:  Worsening Chronicity:  New Relieved by:  Nothing Worsened by:  Nothing Ineffective treatments:  None tried   Past Medical History:  Diagnosis Date  . Anxiety   . Thyroid disease     Patient Active Problem List   Diagnosis Date Noted  . Hashimoto's thyroiditis 12/03/2013  . Unspecified hypothyroidism 12/01/2013    History reviewed. No pertinent surgical history.   OB History   No obstetric history on file.      Home Medications    Prior to Admission medications   Medication Sig Start Date End Date Taking? Authorizing Provider  amphetamine-dextroamphetamine (ADDERALL XR) 30 MG 24 hr capsule Take 1 capsule (30 mg total) by mouth daily. 04/27/18   Eksir, Richard Miu, MD  hydrOXYzine (ATARAX/VISTARIL) 10 MG tablet Take 1 tablet (10 mg total) by mouth at bedtime as needed. 02/27/18   Aundra Dubin, MD  levonorgestrel-ethinyl estradiol ([place]) 0.15-0.03 MG  tablet  11/25/13   [provider]  levothyroxine (SYNTHROID, LEVOTHROID) 50 MCG tablet Take 50 mcg by mouth daily before breakfast.    [provider]  sertraline (ZOLOFT) 100 MG tablet Take 1 tablet (100 mg total) by mouth daily. 02/27/18   Eksir, Richard Miu, MD    Family History Family History  Problem Relation Age of Onset  . Hyperlipidemia Father   . Hypertension Father   . Diabetes Paternal Grandfather     Social History Social History   Tobacco Use  . Smoking status: Never Smoker  . Smokeless tobacco: Never Used  Substance Use Topics  . Alcohol use: Yes    Comment: social use  . Drug use: No     Allergies   Septra [bactrim]   Review of Systems Review of Systems  Neurological: Positive for dizziness.  All other systems reviewed and are negative.    Physical Exam Updated Vital Signs BP 127/76 (BP Location: Right Arm)   Pulse (!) 114   Temp 99.6 F (37.6 C) (Oral)   Resp 16   Ht 5\' 6"  (1.676 m)   Wt 65.8 kg   SpO2 100%   BMI 23.40 kg/m   Physical Exam Vitals signs and nursing note reviewed.  Constitutional:      General: She is not in acute distress.    Appearance: She is well-developed. She is not diaphoretic.  HENT:     Head: Normocephalic and atraumatic.  Eyes:     Extraocular Movements: Extraocular movements intact.     Pupils: Pupils are equal, round, and  reactive to light.  Neck:     Musculoskeletal: Normal range of motion and neck supple.  Cardiovascular:     Rate and Rhythm: Normal rate and regular rhythm.     Heart sounds: No murmur. No friction rub. No gallop.   Pulmonary:     Effort: Pulmonary effort is normal. No respiratory distress.     Breath sounds: Normal breath sounds. No wheezing.  Abdominal:     General: Bowel sounds are normal. There is no distension.     Palpations: Abdomen is soft.     Tenderness: There is no abdominal tenderness.  Musculoskeletal: Normal range of motion.  Skin:    General: Skin  is warm and dry.  Neurological:     General: No focal deficit present.     Mental Status: She is alert and oriented to person, place, and time.     Cranial Nerves: No cranial nerve deficit.     Motor: No weakness.     Coordination: Coordination normal.      ED Treatments / Results  Labs (all labs ordered are listed, but only abnormal results are displayed) Labs Reviewed  BASIC METABOLIC PANEL - Abnormal; Notable for the following components:      Result Value   Glucose, Bld 122 (*)    All other components within normal limits  CBC - Abnormal; Notable for the following components:   Hemoglobin 11.1 (*)    MCV 77.1 (*)    MCH 23.5 (*)    RDW 15.6 (*)    All other components within normal limits  URINALYSIS, ROUTINE W REFLEX MICROSCOPIC - Abnormal; Notable for the following components:   APPearance CLOUDY (*)    All other components within normal limits  I-STAT BETA HCG BLOOD, ED ([facility], [facility], [facility] ONLY)  CBG MONITORING, ED    EKG EKG Interpretation  Date/Time:  Friday June 26 2019 13:44:39 EDT Ventricular Rate:  101 PR Interval:    QRS Duration: 71 QT Interval:  324 QTC Calculation: 420 R Axis:   91 Text Interpretation:  Sinus tachycardia with irregular rate Borderline right axis deviation Borderline T wave abnormalities Confirmed by Geoffery Lyons ([id number]) on 06/26/2019 2:21:55 PM   Radiology No results found.  Procedures Procedures (including critical care time)  Medications Ordered in ED Medications  sodium chloride flush (NS) 0.9 % injection 3 mL (3 mLs Intravenous Not Given 06/26/19 1401)     Initial Impression / Assessment and Plan / ED Course  I have reviewed the triage vital signs and the nursing notes.  Pertinent labs & imaging results that were available during my care of the patient were reviewed by me and considered in my medical decision making (see chart for details).  Patient presents with complaints of dizziness, the etiology of which I am  uncertain.  She describes feeling lightheaded and anxious.  This comes on without warning.  Her vital signs are stable and work-up shows unremarkable laboratory studies.  Pregnancy test is negative.  She also describes intermittent headaches that have increased in frequency during these dizzy episodes.  Neurologically, she is intact and head CT is negative.  At this point, I feel as though discharge is appropriate.  She is to follow-up with primary doctor.  Final Clinical Impressions(s) / ED Diagnoses   Final diagnoses:  None    ED Discharge Orders    None       Geoffery Lyons, MD 06/26/19 1642

## 2019-06-26 NOTE — ED Notes (Signed)
ED Provider at bedside. 

## 2019-06-26 NOTE — ED Triage Notes (Signed)
Patient states she has been having dizziness x 1 week. Patient states she feels like the dizziness is causing her anxiety to flare up. Patient states she gets anxious and feels like she is having heart palpitations.

## 2019-06-26 NOTE — Discharge Instructions (Addendum)
Follow-up with your primary doctor if symptoms do not improve, and return to the ER if symptoms significantly worsen or change.

## 2019-12-22 ENCOUNTER — Other Ambulatory Visit: Payer: Self-pay | Admitting: Obstetrics and Gynecology

## 2019-12-22 DIAGNOSIS — N979 Female infertility, unspecified: Secondary | ICD-10-CM

## 2019-12-28 ENCOUNTER — Ambulatory Visit
Admission: RE | Admit: 2019-12-28 | Discharge: 2019-12-28 | Disposition: A | Discharge status: Home / Self Care | Payer: 59 | Source: Ambulatory Visit | Attending: Obstetrics and Gynecology | Admitting: Obstetrics and Gynecology

## 2019-12-28 DIAGNOSIS — N979 Female infertility, unspecified: Secondary | ICD-10-CM

## 2021-05-08 IMAGING — CT CT HEAD W/O CM
3 series · 15 of 47 positions shown, 18 images · non-contrast
Comparison: None.

CLINICAL DATA: Headaches and dizziness for the past week.

EXAM:
CT HEAD WITHOUT CONTRAST
TECHNIQUE: Contiguous axial images were obtained from the base of the skull
through the vertex without intravenous contrast.

[Series 2: head wo · axial · 0.45mm/px · z∈[-148,-23]mm · 9 of 30 slices shown, 12 images]
[im 3/30  brain]
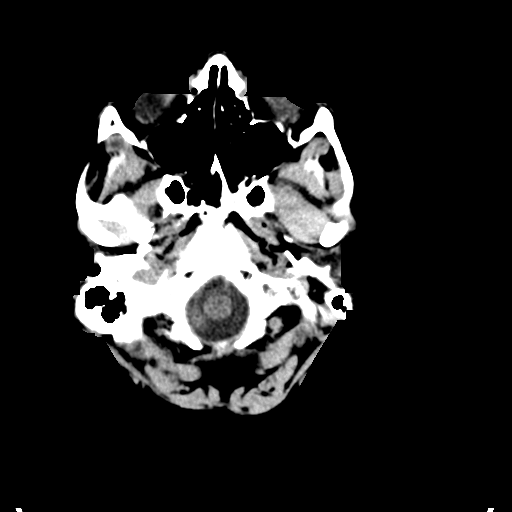
[im 3/30  bone]
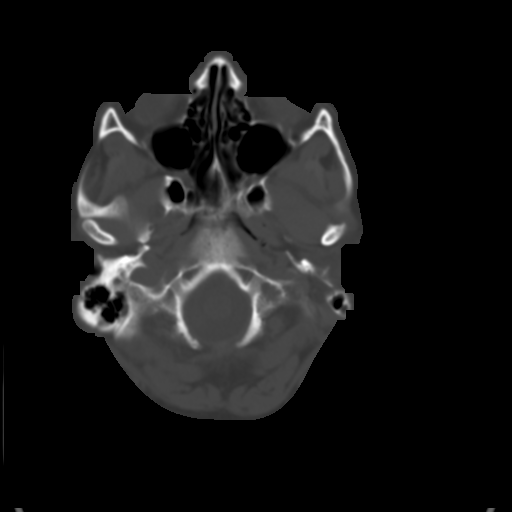
[im 6/30  brain]
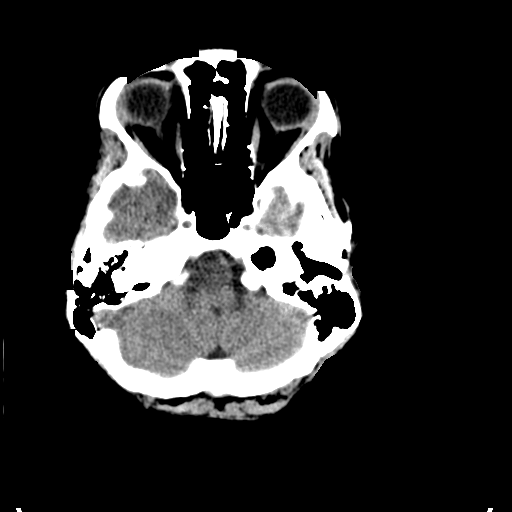
[im 9/30  brain]
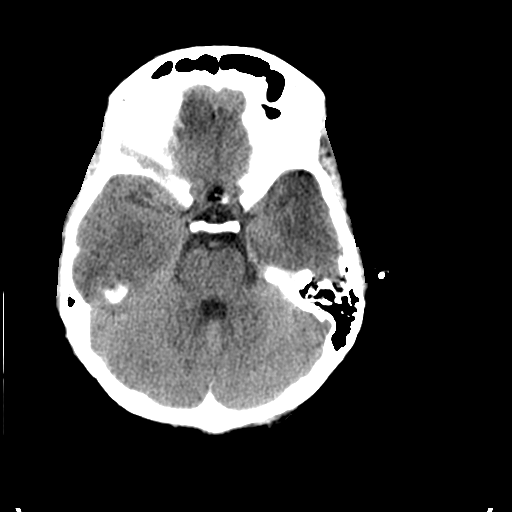
[im 12/30  brain]
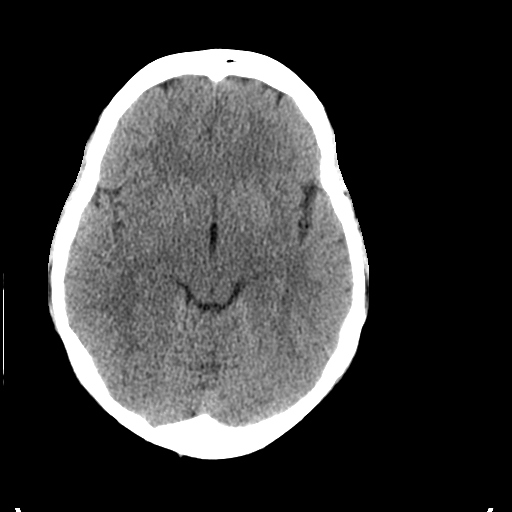
[im 16/30  brain]
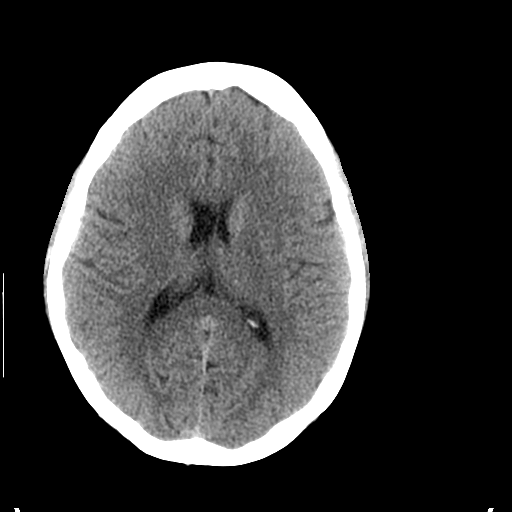
[im 16/30  bone]
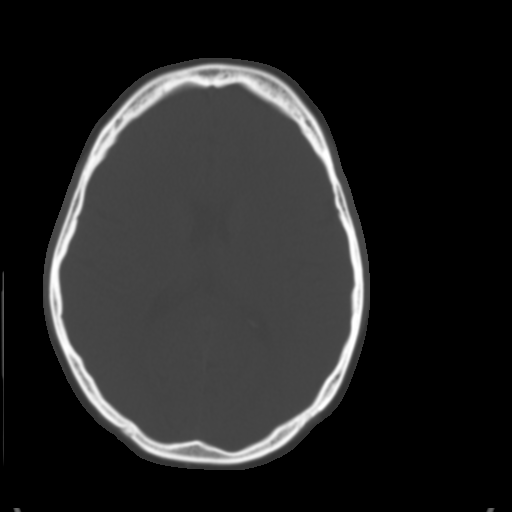
[im 19/30  brain]
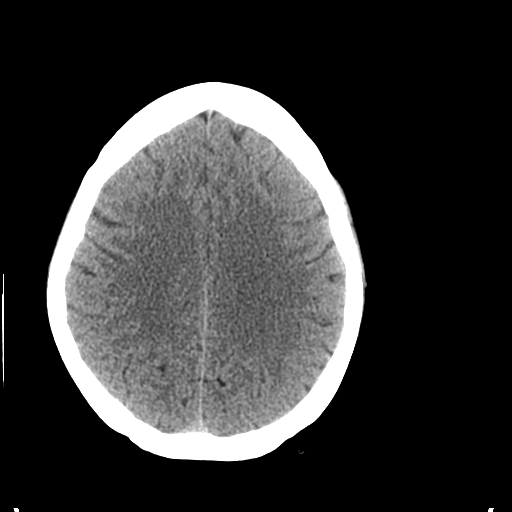
[im 22/30  brain]
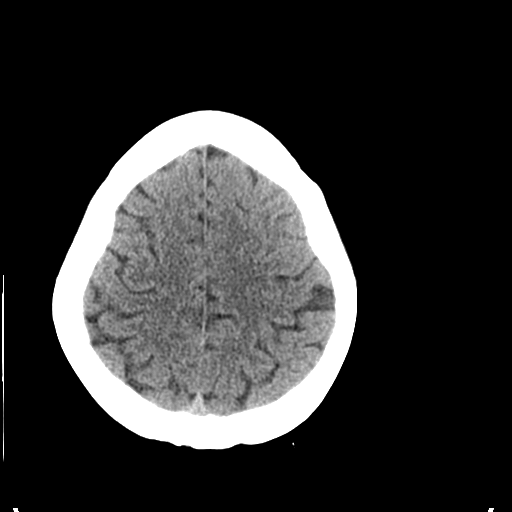
[im 25/30  brain]
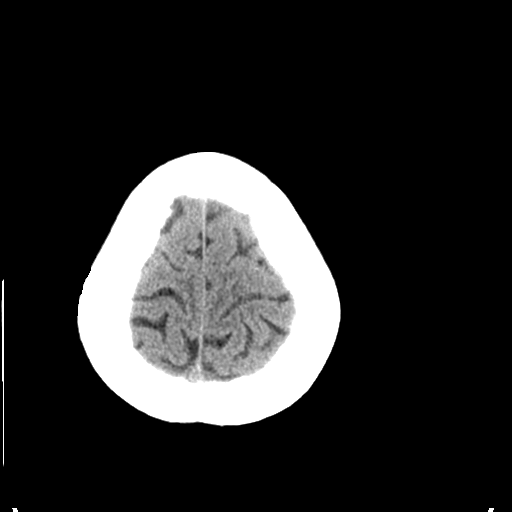
[im 28/30  brain]
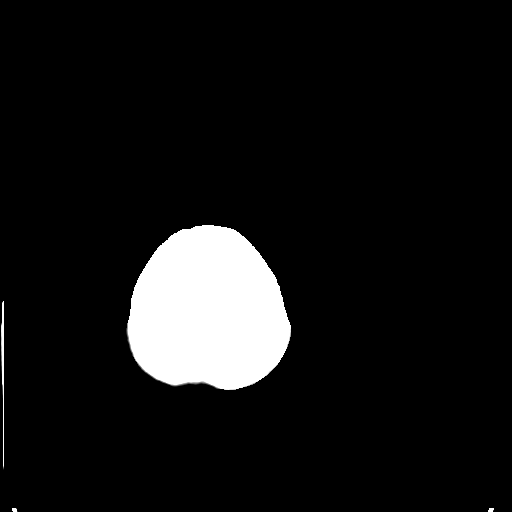
[im 28/30  bone]
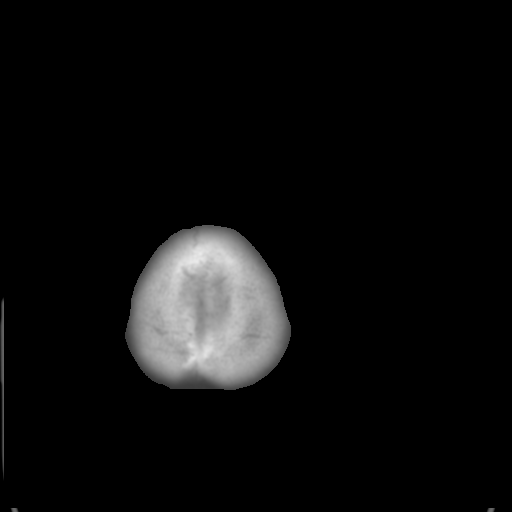

[Series 5: coronal soft tissue · coronal · 0.29mm/px · 3 of 76 slices shown]
[im 26/76  brain]
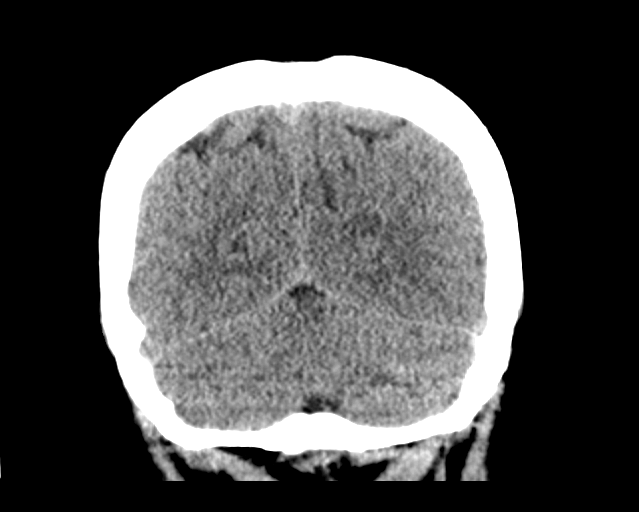
[im 34/76  brain]
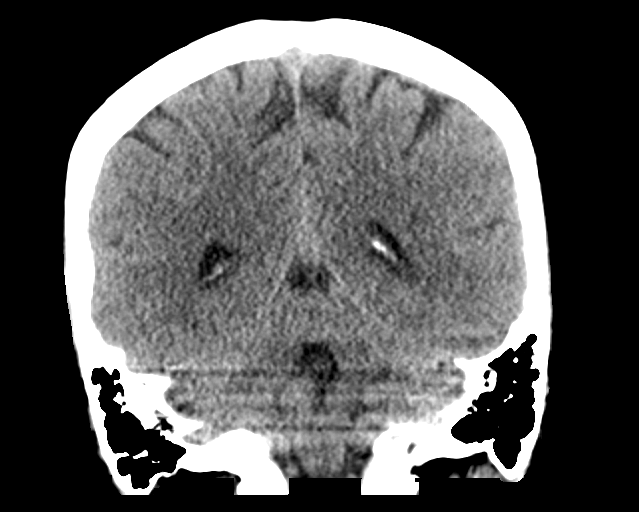
[im 42/76  brain]
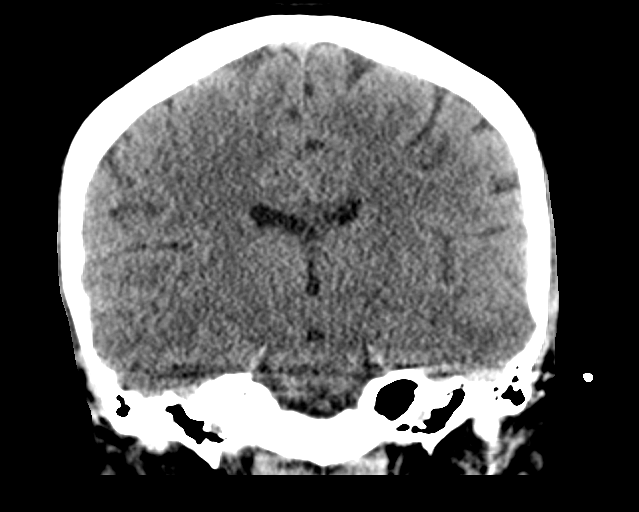

[Series 6: sagittal soft tissue · sagittal · 0.29mm/px · 3 of 67 slices shown]
[im 23/67  brain]
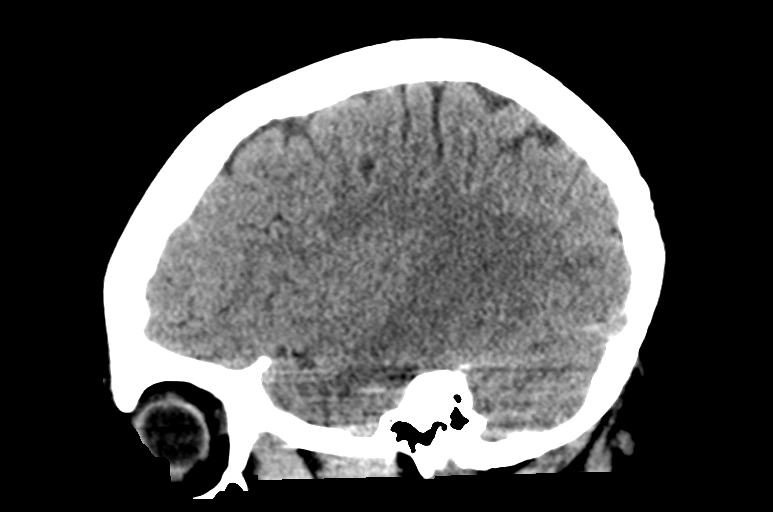
[im 34/67  brain]
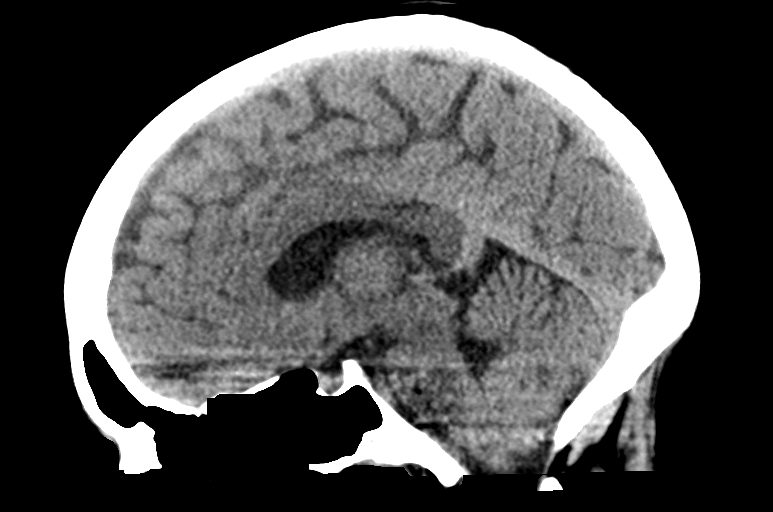
[im 45/67  brain]
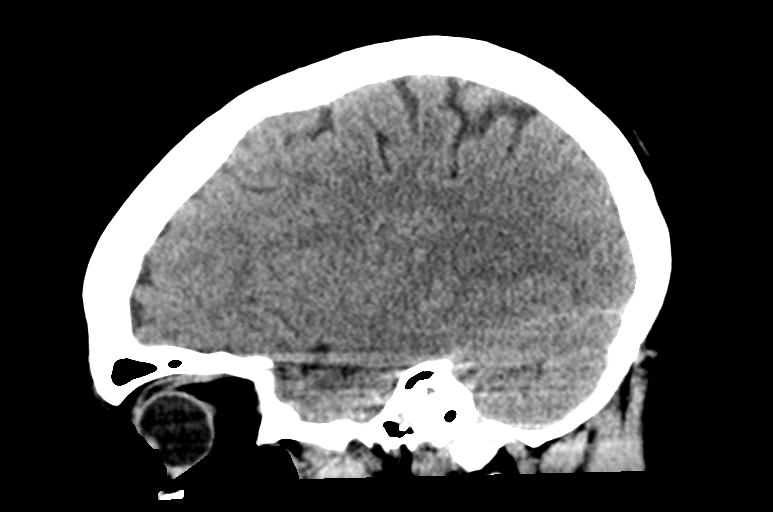

[15 of 47 positions shown; findings below may reference images not displayed]

FINDINGS: Brain: No evidence of acute infarction, hemorrhage, hydrocephalus,
extra-axial collection or mass lesion/mass effect.

Vascular: No hyperdense vessel or unexpected calcification.

Skull: Normal. Negative for fracture or focal lesion.

Sinuses/Orbits: No acute finding.

Other: None.
IMPRESSION: 1. Normal noncontrast head CT.

## 2021-11-09 IMAGING — RF DG HYSTEROGRAM
1 series · 6 of 6 positions shown · IV contrast (omnipaque)
Comparison: None.

CLINICAL DATA: Primary female infertility.

EXAM:
HYSTEROSALPINGOGRAM
TECHNIQUE: Following cleansing of the cervix and vagina with Betadine solution,
a hysterosalpingogram was performed using a 5-French
hysterosalpingogram catheter and Omnipaque 300 contrast. The patient
tolerated the examination without difficulty.

[Series 1: one shot · 0.14mm/px · 6 of 6 slices shown]
[im 1/6]
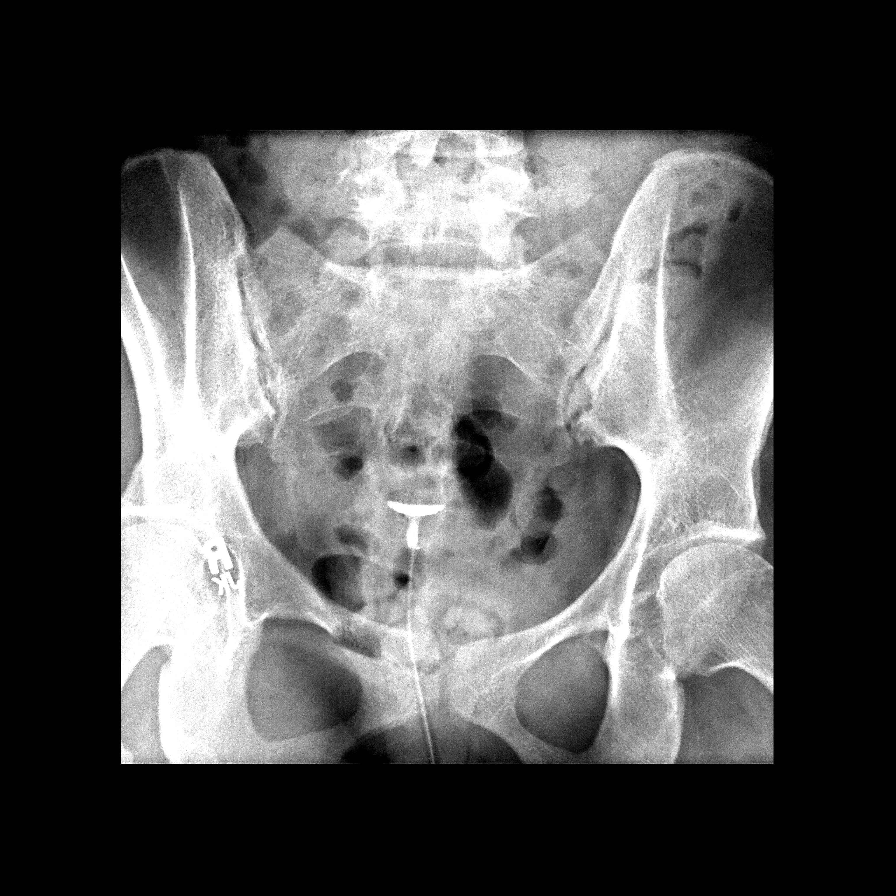
[im 2/6]
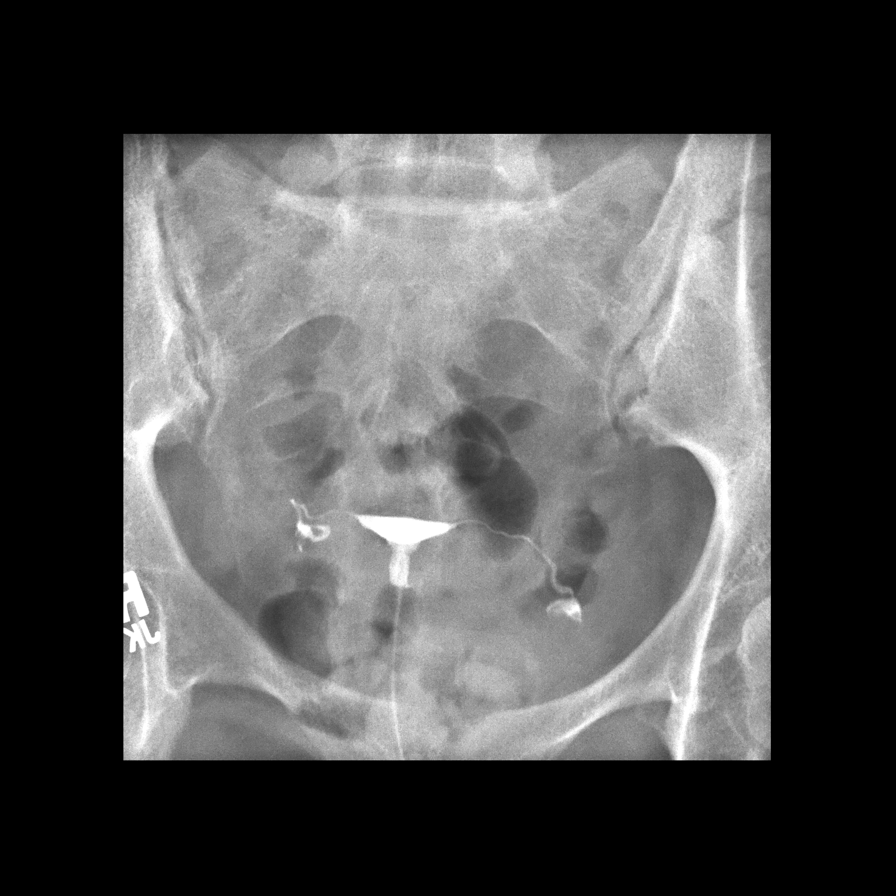
[im 3/6]
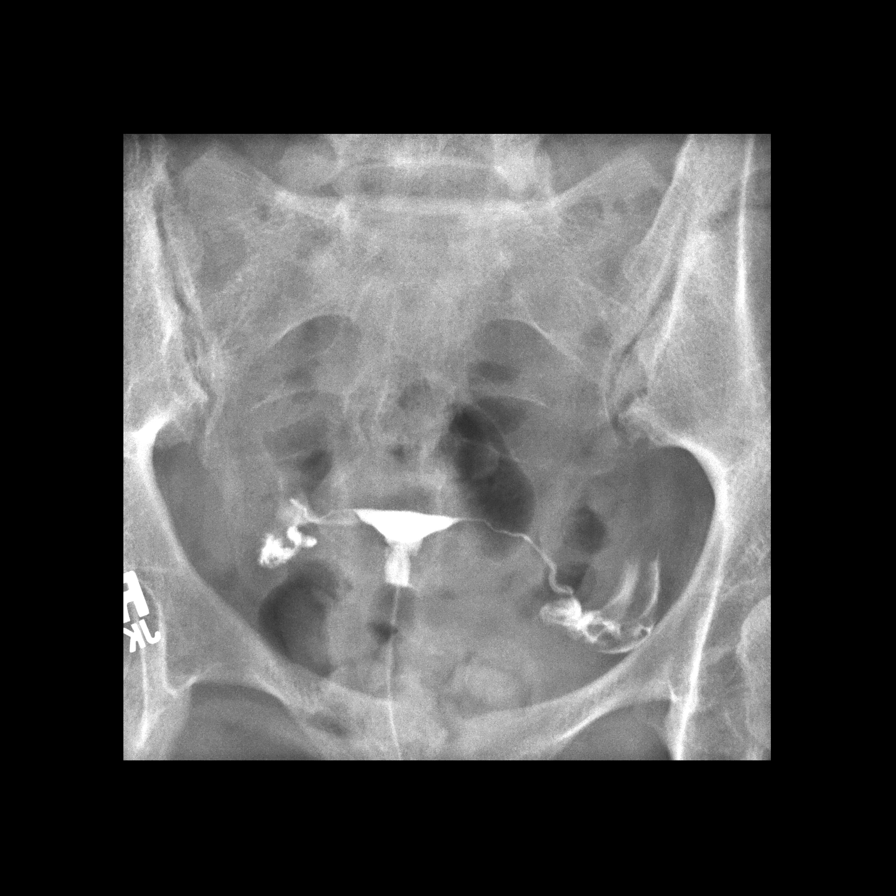
[im 4/6]
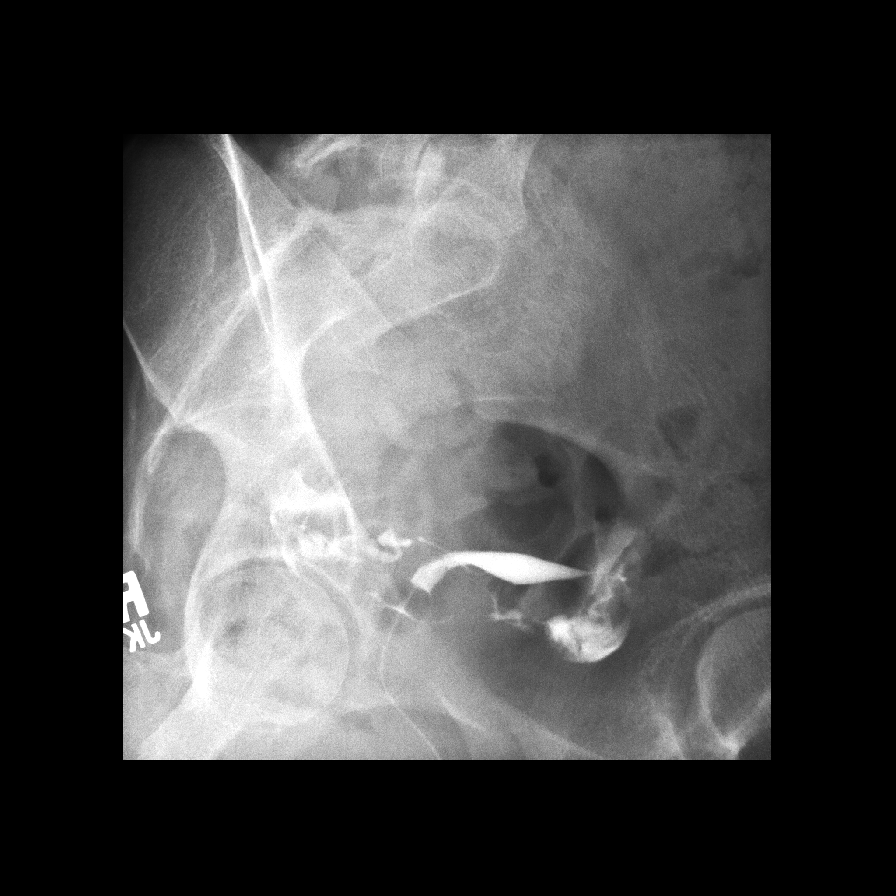
[im 5/6]
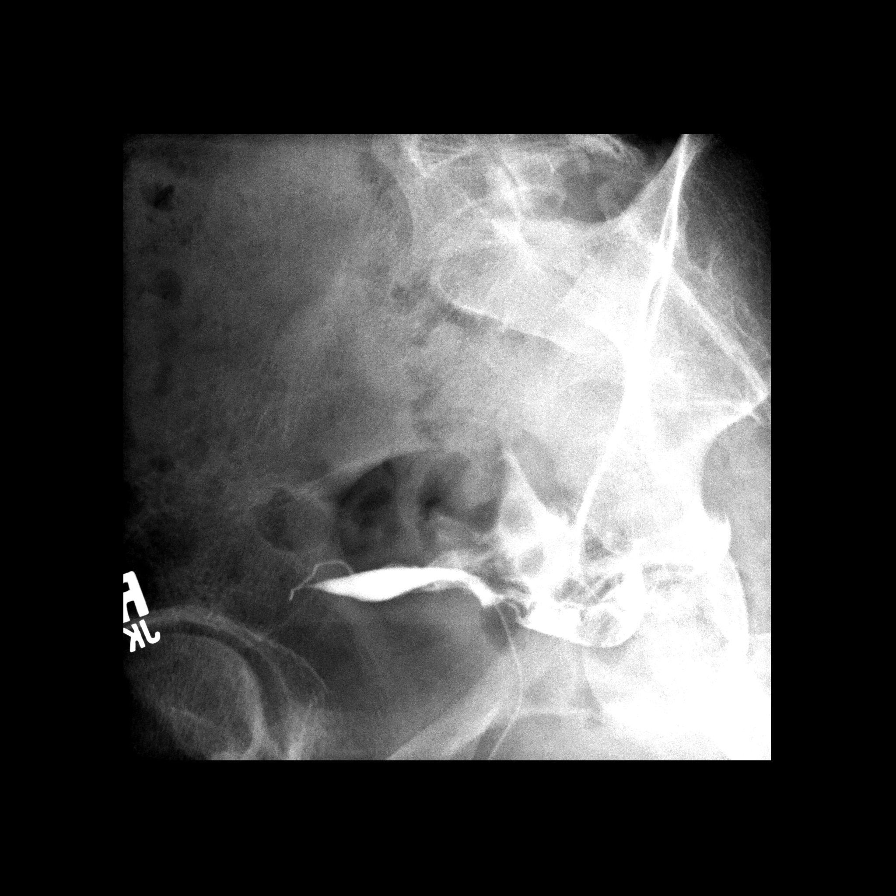
[im 6/6]
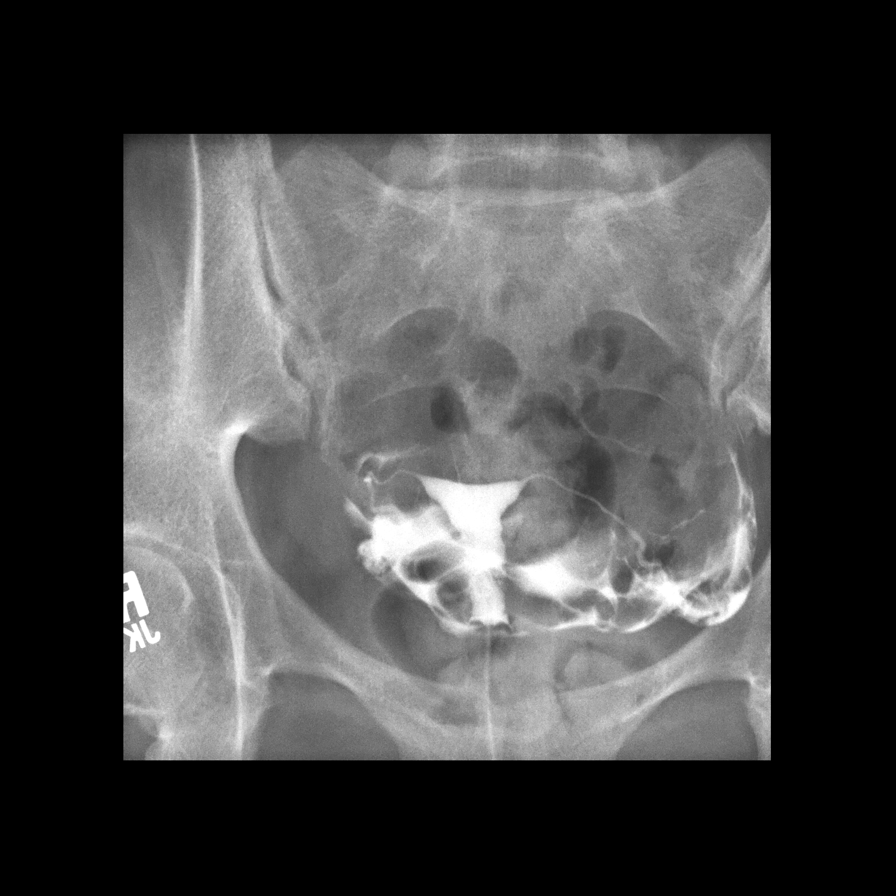

[6 of 6 positions shown; findings below may reference images not displayed]

FLUOROSCOPY TIME:  Fluoroscopy Time:  0 minutes 24 seconds

Number of Acquired Images:  6
FINDINGS: Normal uterine cavity contour with no uterine cavity filling
defects.

Prompt opacification of the fallopian tubes bilaterally in their
entirety. Fallopian tubes appear normal in caliber and appearance.
Normal spillage of contrast from the fimbriated ends of both tubes
and normal dispersal of contrast within the peritoneal cavity
bilaterally.
IMPRESSION: 1. Patent normal fallopian tubes bilaterally.
2. Normal uterine cavity.

## 2022-08-07 DIAGNOSIS — R309 Painful micturition, unspecified: Secondary | ICD-10-CM | POA: Diagnosis not present

## 2022-08-07 DIAGNOSIS — R300 Dysuria: Secondary | ICD-10-CM | POA: Diagnosis not present

## 2022-08-07 DIAGNOSIS — Z113 Encounter for screening for infections with a predominantly sexual mode of transmission: Secondary | ICD-10-CM | POA: Diagnosis not present

## 2022-08-17 DIAGNOSIS — D649 Anemia, unspecified: Secondary | ICD-10-CM | POA: Diagnosis not present

## 2022-10-12 DIAGNOSIS — Z113 Encounter for screening for infections with a predominantly sexual mode of transmission: Secondary | ICD-10-CM | POA: Diagnosis not present

## 2022-10-12 DIAGNOSIS — Z6823 Body mass index (BMI) 23.0-23.9, adult: Secondary | ICD-10-CM | POA: Diagnosis not present

## 2022-10-12 DIAGNOSIS — Z01419 Encounter for gynecological examination (general) (routine) without abnormal findings: Secondary | ICD-10-CM | POA: Diagnosis not present

## 2022-11-29 DIAGNOSIS — H52203 Unspecified astigmatism, bilateral: Secondary | ICD-10-CM | POA: Diagnosis not present

## 2022-11-29 DIAGNOSIS — H5213 Myopia, bilateral: Secondary | ICD-10-CM | POA: Diagnosis not present

## 2022-12-01 ENCOUNTER — Ambulatory Visit (HOSPITAL_COMMUNITY)
Admission: EM | Admit: 2022-12-01 | Discharge: 2022-12-01 | Disposition: A | Discharge status: Home / Self Care | Payer: BC Managed Care – PPO

## 2022-12-01 ENCOUNTER — Encounter (HOSPITAL_COMMUNITY): Payer: Self-pay

## 2022-12-01 DIAGNOSIS — J020 Streptococcal pharyngitis: Secondary | ICD-10-CM

## 2022-12-01 LAB — POCT RAPID STREP A, ED / UC: Streptococcus, Group A Screen (Direct): POSITIVE — AB

## 2022-12-01 MED ORDER — AMOXICILLIN 500 MG PO CAPS: 500.0000 mg | ORAL_CAPSULE | Freq: Three times a day (TID) | ORAL | 0 refills | Status: DC

## 2022-12-01 NOTE — Discharge Instructions (Addendum)
Your strep test was positive in clinic, I have sent you amoxicillin, please take this as scheduled and until all antibiotics are finished.  You can take with food to help with GI upset.  For your sore throat you can do saline gargles, tea with honey, and then alternating between Tylenol and ibuprofen every 6-8 hours as needed for sore throat, fevers and bodyaches.  Please return to clinic if you develop fever despite medication, shortness of breath, or no improvement with antibiotics.

## 2022-12-01 NOTE — ED Provider Notes (Signed)
[facility]    CSN: [id number] Arrival date & time: 12/01/22  1635      History   Chief Complaint Chief Complaint  Patient presents with   Sore Throat    HPI Alyssa Mccarty is a 32 y.o. female.   Reports sore throat and body aches that started this morning, has not taken any medications.  Denies any sick contacts.  Denies fevers. Reports maybe some nasuea, denies abodminal pain or emesis.  Denies cough, SOB or chest pain.   The history is provided by the patient.  Sore Throat Pertinent negatives include no chest pain and no abdominal pain.    Past Medical History:  Diagnosis Date   Anxiety    Thyroid disease     Patient Active Problem List   Diagnosis Date Noted   Hashimoto's thyroiditis 12/03/2013   Unspecified hypothyroidism 12/01/2013    History reviewed. No pertinent surgical history.  OB History   No obstetric history on file.      Home Medications    Prior to Admission medications   Medication Sig Start Date End Date Taking? Authorizing Provider  amoxicillin (AMOXIL) 500 MG capsule Take 1 capsule (500 mg total) by mouth 3 (three) times daily. 12/01/22  Yes Louretta Shorten, [place], FNP  amphetamine-dextroamphetamine (ADDERALL XR) 30 MG 24 hr capsule Take 1 capsule (30 mg total) by mouth daily. 04/27/18  Yes Eksir, Richard Miu, MD  amphetamine-dextroamphetamine (ADDERALL) 15 MG tablet Take 15 mg by mouth every evening.   Yes [provider]  levonorgestrel-ethinyl estradiol ([place]) 0.15-0.03 MG tablet Take 1 tablet by mouth daily. 09/20/22 09/20/23 Yes [provider]  levothyroxine (SYNTHROID, LEVOTHROID) 50 MCG tablet Take 50 mcg by mouth daily before breakfast.   Yes [provider]  LORazepam (ATIVAN) 0.5 MG tablet Take 0.5 mg by mouth daily as needed for anxiety.   Yes [provider]  sertraline (ZOLOFT) 100 MG tablet Take 1 tablet (100 mg total) by mouth daily. 02/27/18  Yes Eksir, Richard Miu, MD     Family History Family History  Problem Relation Age of Onset   Hyperlipidemia Father    Hypertension Father    Diabetes Paternal Grandfather     Social History Social History   Tobacco Use   Smoking status: Never    Passive exposure: Never   Smokeless tobacco: Never  Vaping Use   Vaping Use: Never used  Substance Use Topics   Alcohol use: Yes    Comment: social use   Drug use: No     Allergies   Septra [bactrim]   Review of Systems Review of Systems  Constitutional:  Negative for fatigue and fever.  HENT:  Positive for sore throat. Negative for ear discharge and ear pain.   Respiratory:  Negative for cough.   Cardiovascular:  Negative for chest pain.  Gastrointestinal:  Positive for nausea. Negative for abdominal pain, diarrhea and vomiting.  Genitourinary:  Negative for dysuria.  Musculoskeletal:  Positive for arthralgias.     Physical Exam Triage Vital Signs ED Triage Vitals  Enc Vitals Group     BP 12/01/22 1721 (!) 105/59     Pulse Rate 12/01/22 1721 (!) 103     Resp 12/01/22 1721 16     Temp 12/01/22 1721 98.7 F (37.1 C)     Temp Source 12/01/22 1721 Oral     SpO2 12/01/22 1721 98 %     Weight 12/01/22 1721 148 lb (67.1 kg)     Height 12/01/22  1721 5\' 6"  (1.676 m)     Head Circumference --      Peak Flow --      Pain Score 12/01/22 1718 5     Pain Loc --      Pain Edu? --      Excl. in [place]? --    No data found.  Updated Vital Signs BP (!) 105/59 (BP Location: Left Arm)   Pulse (!) 103   Temp 98.7 F (37.1 C) (Oral)   Resp 16   Ht 5\' 6"  (1.676 m)   Wt 148 lb (67.1 kg)   LMP 10/23/2022 (Exact Date)   SpO2 98%   BMI 23.89 kg/m   Visual Acuity Right Eye Distance:   Left Eye Distance:   Bilateral Distance:    Right Eye Near:   Left Eye Near:    Bilateral Near:     Physical Exam Vitals and nursing note reviewed.  Constitutional:      General: She is not in acute distress.    Appearance: She is well-developed.  HENT:      Head: Normocephalic and atraumatic.     Right Ear: External ear normal.     Left Ear: External ear normal.     Mouth/Throat:     Mouth: Mucous membranes are moist.     Pharynx: Posterior oropharyngeal erythema present.     Tonsils: Tonsillar exudate present. No tonsillar abscesses. 2+ on the right. 2+ on the left.  Eyes:     Conjunctiva/sclera: Conjunctivae normal.  Cardiovascular:     Rate and Rhythm: Normal rate and regular rhythm.     Heart sounds: Normal heart sounds, S1 normal and S2 normal. No murmur heard. Pulmonary:     Effort: Pulmonary effort is normal. No respiratory distress.     Breath sounds: Normal breath sounds.     Comments: Lungs vesicular posteriorly. Musculoskeletal:        General: No swelling. Normal range of motion.     Cervical back: Neck supple.  Lymphadenopathy:     Cervical: Cervical adenopathy present.  Skin:    General: Skin is warm and dry.     Capillary Refill: Capillary refill takes less than 2 seconds.  Neurological:     Mental Status: She is alert.  Psychiatric:        Mood and Affect: Mood normal.        Behavior: Behavior is cooperative.      UC Treatments / Results  Labs (all labs ordered are listed, but only abnormal results are displayed) Labs Reviewed  POCT RAPID STREP A, ED / UC - Abnormal; Notable for the following components:      Result Value   Streptococcus, Group A Screen (Direct) POSITIVE (*)    All other components within normal limits    EKG   Radiology No results found.  Procedures Procedures (including critical care time)  Medications Ordered in UC Medications - No data to display  Initial Impression / Assessment and Plan / UC Course  I have reviewed the triage vital signs and the nursing notes.  Pertinent labs & imaging results that were available during my care of the patient were reviewed by me and considered in my medical decision making (see chart for details).  Vitals and triage reviewed, patient is  hemodynamically stable.  Lungs vesicular.  Uvula midline, low concern for peritonsillar abscess.  Onset of sore throat that started today, tonsils with swelling and exudate on exam, rapid strep positive, will cover  with amoxicillin.  Plan of care, follow-up care, and return precautions discussed.  Patient verbalized understanding, no questions at this time.     Final Clinical Impressions(s) / UC Diagnoses   Final diagnoses:  Strep throat     Discharge Instructions      Your strep test was positive in clinic, I have sent you amoxicillin, please take this as scheduled and until all antibiotics are finished.  You can take with food to help with GI upset.  For your sore throat you can do saline gargles, tea with honey, and then alternating between Tylenol and ibuprofen every 6-8 hours as needed for sore throat, fevers and bodyaches.  Please return to clinic if you develop fever despite medication, shortness of breath, or no improvement with antibiotics.       ED Prescriptions     Medication Sig Dispense Auth. Provider   amoxicillin (AMOXIL) 500 MG capsule Take 1 capsule (500 mg total) by mouth 3 (three) times daily. 21 capsule Ayvin Lipinski, [place], [place]      PDMP not reviewed this encounter.   Marelin Tat, [place],  12/01/22 1758

## 2022-12-01 NOTE — ED Triage Notes (Signed)
Patient having sore throat and body aches onset last night. No known sick exposure.   No meds tried

## 2022-12-12 DIAGNOSIS — J029 Acute pharyngitis, unspecified: Secondary | ICD-10-CM | POA: Diagnosis not present

## 2022-12-12 DIAGNOSIS — R070 Pain in throat: Secondary | ICD-10-CM | POA: Diagnosis not present

## 2022-12-13 ENCOUNTER — Ambulatory Visit
Admission: RE | Admit: 2022-12-13 | Discharge: 2022-12-13 | Disposition: A | Discharge status: Home / Self Care | Payer: BC Managed Care – PPO | Source: Ambulatory Visit | Attending: Physician Assistant | Admitting: Physician Assistant

## 2022-12-13 VITALS — BP 105/64 | HR 95 | Temp 98.2°F | Resp 19

## 2022-12-13 DIAGNOSIS — Z113 Encounter for screening for infections with a predominantly sexual mode of transmission: Secondary | ICD-10-CM | POA: Diagnosis not present

## 2022-12-13 DIAGNOSIS — J020 Streptococcal pharyngitis: Secondary | ICD-10-CM | POA: Insufficient documentation

## 2022-12-13 DIAGNOSIS — R300 Dysuria: Secondary | ICD-10-CM | POA: Insufficient documentation

## 2022-12-13 DIAGNOSIS — J029 Acute pharyngitis, unspecified: Secondary | ICD-10-CM | POA: Diagnosis not present

## 2022-12-13 LAB — POCT URINALYSIS DIP (MANUAL ENTRY)
Bilirubin, UA: NEGATIVE
Blood, UA: NEGATIVE
Glucose, UA: NEGATIVE mg/dL
Ketones, POC UA: NEGATIVE mg/dL
Nitrite, UA: NEGATIVE
Spec Grav, UA: 1.025 (ref 1.010–1.025)
Urobilinogen, UA: 1 E.U./dL
pH, UA: 7 (ref 5.0–8.0)

## 2022-12-13 LAB — POCT RAPID STREP A (OFFICE): Rapid Strep A Screen: POSITIVE — AB

## 2022-12-13 LAB — POCT MONO SCREEN (KUC): Mono, POC: NEGATIVE

## 2022-12-13 LAB — POCT URINE PREGNANCY: Preg Test, Ur: NEGATIVE

## 2022-12-13 MED ORDER — AZITHROMYCIN 500 MG PO TABS: 500.0000 mg | ORAL_TABLET | Freq: Every day | ORAL | 0 refills | Status: DC

## 2022-12-13 NOTE — ED Triage Notes (Addendum)
Pt presents to [facility] with co of sore throat since yesterday morning. She reports she went to [facility] yesterday and was neg for strep. Pt reports she recently has strep but Is concerned it is back. Pain is worse today than yesterday.   Pt is concerned if it is not strep if it is a sti, pt is also concerned she may need a vaginal swab she has urinary frequency and burning since Monday.

## 2022-12-13 NOTE — Discharge Instructions (Addendum)
Lab testing will be completed in 48 hours.  If you do not get a call from this office that indicates the test are negative.  Log onto MyChart to view the test results when they post in 48 hours.  Advised to use ibuprofen or Motrin for pain relief along with salt water gargles and lozenges to help soothe the sore throat.

## 2022-12-13 NOTE — ED Provider Notes (Signed)
[facility]    CSN: [id number] Arrival date & time: 12/13/22  0913      History   Chief Complaint Chief Complaint  Patient presents with   Sore Throat   sti testing   Dysuria    HPI Alyssa Mccarty is a 32 y.o. female.   32 year old female presents with sore throat, painful swallowing.  Patient indicates that 2 weeks ago she was treated for strep throat and took a prescription of amoxicillin and finished the regimen except for the very last day.  Patient indicates that the sore throat resolved.  However she indicates over the past couple days she has had the sore throat return, very painful, pain with swallowing.  Patient indicates she is not having fever, chills, cough or congestion.  Patient indicates that she has not been around any family, friends, or coworkers with strep throat. Patient also indicates that she has been having mild frequency, urgency, and intermittent dysuria.  She indicates she is not having any back pain, and has not noticed any blood in the urine.  She is without lower abdominal or pelvic pain. Patient desires to have STI screening.  She indicates that she is in a relationship and desires to have STI screening to include HIV and RPR.  Patient is also concerned that if the strep test is negative that she would request to have oral screening for gonorrhea due to oral sex.  Patient indicates that she is not having any vaginal symptoms, no vaginal discharge.  Patient indicates that she is on a 90-day birth control regimen and has a period once every 90 days.  She does indicate that she had some mild spotting on February 6.   She is tolerating fluids well without any nausea or vomiting.   Sore Throat  Dysuria   Past Medical History:  Diagnosis Date   Anxiety    Thyroid disease     Patient Active Problem List   Diagnosis Date Noted   Hashimoto's thyroiditis 12/03/2013   Unspecified hypothyroidism 12/01/2013    History reviewed. No pertinent  surgical history.  OB History   No obstetric history on file.      Home Medications    Prior to Admission medications   Medication Sig Start Date End Date Taking? Authorizing Provider  azithromycin (ZITHROMAX) 500 MG tablet Take 1 tablet (500 mg total) by mouth daily. 12/13/22  Yes Nyoka Lint, PA-C  amoxicillin (AMOXIL) 500 MG capsule Take 1 capsule (500 mg total) by mouth 3 (three) times daily. 12/01/22   Garrison, [place] N, FNP  amphetamine-dextroamphetamine (ADDERALL XR) 30 MG 24 hr capsule Take 1 capsule (30 mg total) by mouth daily. 04/27/18   Eksir, Richard Miu, MD  amphetamine-dextroamphetamine (ADDERALL) 15 MG tablet Take 15 mg by mouth every evening.    [provider]  levonorgestrel-ethinyl estradiol (SEASONALE) 0.15-0.03 MG tablet Take 1 tablet by mouth daily. 09/20/22 09/20/23  [provider]  levothyroxine (SYNTHROID, LEVOTHROID) 50 MCG tablet Take 50 mcg by mouth daily before breakfast.    [provider]  LORazepam (ATIVAN) 0.5 MG tablet Take 0.5 mg by mouth daily as needed for anxiety.    [provider]  sertraline (ZOLOFT) 100 MG tablet Take 1 tablet (100 mg total) by mouth daily. 02/27/18   Eksir, Richard Miu, MD    Family History Family History  Problem Relation Age of Onset   Hyperlipidemia Father    Hypertension Father    Diabetes Paternal Grandfather     Social  History Social History   Tobacco Use   Smoking status: Never    Passive exposure: Never   Smokeless tobacco: Never  Vaping Use   Vaping Use: Never used  Substance Use Topics   Alcohol use: Yes    Comment: social use   Drug use: No     Allergies   Septra [bactrim]   Review of Systems Review of Systems  HENT:  Positive for sore throat.   Genitourinary:  Positive for dysuria.     Physical Exam Triage Vital Signs ED Triage Vitals  Enc Vitals Group     BP 12/13/22 0940 105/64     Pulse Rate 12/13/22 0938 95     Resp 12/13/22 0938 19      Temp 12/13/22 0938 98.2 F (36.8 C)     Temp src --      SpO2 12/13/22 0938 98 %     Weight --      Height --      Head Circumference --      Peak Flow --      Pain Score 12/13/22 0937 8     Pain Loc --      Pain Edu? --      Excl. in [place]? --    No data found.  Updated Vital Signs BP 105/64   Pulse 95   Temp 98.2 F (36.8 C)   Resp 19   LMP 10/23/2022 (Exact Date)   SpO2 98%   Visual Acuity Right Eye Distance:   Left Eye Distance:   Bilateral Distance:    Right Eye Near:   Left Eye Near:    Bilateral Near:     Physical Exam Constitutional:      Appearance: She is well-developed.  HENT:     Right Ear: Ear canal normal. Tympanic membrane is injected.     Left Ear: Ear canal normal. Tympanic membrane is injected.     Mouth/Throat:     Mouth: Mucous membranes are moist.     Pharynx: Posterior oropharyngeal erythema present. No oropharyngeal exudate.  Cardiovascular:     Rate and Rhythm: Normal rate and regular rhythm.     Heart sounds: Normal heart sounds.  Pulmonary:     Effort: Pulmonary effort is normal.     Breath sounds: Normal breath sounds and air entry. No wheezing, rhonchi or rales.  Abdominal:     General: Abdomen is flat. Bowel sounds are normal.     Palpations: Abdomen is soft.     Tenderness: There is no abdominal tenderness. There is no guarding or rebound.  Lymphadenopathy:     Cervical: Cervical adenopathy (moderate anterior adenopathy present) present.  Neurological:     Mental Status: She is alert.      UC Treatments / Results  Labs (all labs ordered are listed, but only abnormal results are displayed) Labs Reviewed  POCT URINALYSIS DIP (MANUAL ENTRY) - Abnormal; Notable for the following components:      Result Value   Clarity, UA hazy (*)    Protein Ur, POC trace (*)    Leukocytes, UA Small (1+) (*)    All other components within normal limits  POCT RAPID STREP A (OFFICE) - Abnormal; Notable for the following components:   Rapid  Strep A Screen Positive (*)    All other components within normal limits  HIV ANTIBODY (ROUTINE TESTING W REFLEX)  RPR  POCT URINE PREGNANCY  POCT MONO SCREEN ([facility])  CERVICOVAGINAL ANCILLARY ONLY  EKG   Radiology No results found.  Procedures Procedures (including critical care time)  Medications Ordered in UC Medications - No data to display  Initial Impression / Assessment and Plan / UC Course  I have reviewed the triage vital signs and the nursing notes.  Pertinent labs & imaging results that were available during my care of the patient were reviewed by me and considered in my medical decision making (see chart for details).    Plan: The diagnosis will be treated with the following: 1.  Screening for STI: A.  Treatment may be modified depending on the lab results of STI screening to include HIV and RPR. 2.  Sore throat: A.  Advised take ibuprofen or Motrin along with lozenges and gargles to help soothe the throat. 3.  Strep throat: A.  Zithromax 500 mg once daily for 5 days to treat strep throat. (This is being used as a concern that the last time the strep was treated there may be resistance to the Amoxil) 3.  Dysuria: A.  Advised to increase fluid intake in order to flush the urinary system. 4.  Advised follow-up PCP return to urgent care if symptoms fail to improve. Final Clinical Impressions(s) / UC Diagnoses   Final diagnoses:  Routine screening for STI (sexually transmitted infection)  Sore throat  Dysuria  Streptococcal sore throat     Discharge Instructions      Lab testing will be completed in 48 hours.  If you do not get a call from this office that indicates the test are negative.  Log onto MyChart to view the test results when they post in 48 hours.  Advised to use ibuprofen or Motrin for pain relief along with salt water gargles and lozenges to help soothe the sore throat.      ED Prescriptions     Medication Sig Dispense Auth. Provider    azithromycin (ZITHROMAX) 500 MG tablet Take 1 tablet (500 mg total) by mouth daily. 5 tablet Nyoka Lint, PA-C      PDMP not reviewed this encounter.   Nyoka Lint, PA-C 12/13/22 1043

## 2022-12-14 LAB — CERVICOVAGINAL ANCILLARY ONLY
Bacterial Vaginitis (gardnerella): NEGATIVE
Candida Glabrata: NEGATIVE
Candida Vaginitis: NEGATIVE
Chlamydia: NEGATIVE
Comment: NEGATIVE
Comment: NEGATIVE
Comment: NEGATIVE
Comment: NEGATIVE
Comment: NEGATIVE
Comment: NORMAL
Neisseria Gonorrhea: NEGATIVE
Trichomonas: NEGATIVE

## 2022-12-14 LAB — RPR: RPR Ser Ql: NONREACTIVE

## 2022-12-14 LAB — HIV ANTIBODY (ROUTINE TESTING W REFLEX): HIV Screen 4th Generation wRfx: NONREACTIVE

## 2022-12-21 DIAGNOSIS — F419 Anxiety disorder, unspecified: Secondary | ICD-10-CM | POA: Diagnosis not present

## 2022-12-21 DIAGNOSIS — J020 Streptococcal pharyngitis: Secondary | ICD-10-CM | POA: Diagnosis not present

## 2022-12-21 DIAGNOSIS — F900 Attention-deficit hyperactivity disorder, predominantly inattentive type: Secondary | ICD-10-CM | POA: Diagnosis not present

## 2022-12-21 DIAGNOSIS — E611 Iron deficiency: Secondary | ICD-10-CM | POA: Diagnosis not present

## 2023-01-10 DIAGNOSIS — E063 Autoimmune thyroiditis: Secondary | ICD-10-CM | POA: Diagnosis not present

## 2023-01-10 DIAGNOSIS — E611 Iron deficiency: Secondary | ICD-10-CM | POA: Diagnosis not present

## 2023-01-10 DIAGNOSIS — E049 Nontoxic goiter, unspecified: Secondary | ICD-10-CM | POA: Diagnosis not present

## 2023-01-31 DIAGNOSIS — E611 Iron deficiency: Secondary | ICD-10-CM | POA: Diagnosis not present

## 2023-01-31 DIAGNOSIS — E063 Autoimmune thyroiditis: Secondary | ICD-10-CM | POA: Diagnosis not present

## 2023-03-12 DIAGNOSIS — M542 Cervicalgia: Secondary | ICD-10-CM | POA: Diagnosis not present

## 2023-03-12 DIAGNOSIS — R202 Paresthesia of skin: Secondary | ICD-10-CM | POA: Diagnosis not present

## 2023-03-27 DIAGNOSIS — Z113 Encounter for screening for infections with a predominantly sexual mode of transmission: Secondary | ICD-10-CM | POA: Diagnosis not present

## 2023-04-04 DIAGNOSIS — M50223 Other cervical disc displacement at C6-C7 level: Secondary | ICD-10-CM | POA: Diagnosis not present

## 2023-04-04 DIAGNOSIS — R42 Dizziness and giddiness: Secondary | ICD-10-CM | POA: Diagnosis not present

## 2023-04-04 DIAGNOSIS — J329 Chronic sinusitis, unspecified: Secondary | ICD-10-CM | POA: Diagnosis not present

## 2023-04-04 DIAGNOSIS — M50222 Other cervical disc displacement at C5-C6 level: Secondary | ICD-10-CM | POA: Diagnosis not present

## 2023-04-04 DIAGNOSIS — R202 Paresthesia of skin: Secondary | ICD-10-CM | POA: Diagnosis not present

## 2023-04-04 DIAGNOSIS — M5021 Other cervical disc displacement,  high cervical region: Secondary | ICD-10-CM | POA: Diagnosis not present

## 2023-04-04 DIAGNOSIS — M542 Cervicalgia: Secondary | ICD-10-CM | POA: Diagnosis not present

## 2023-04-04 DIAGNOSIS — M47812 Spondylosis without myelopathy or radiculopathy, cervical region: Secondary | ICD-10-CM | POA: Diagnosis not present

## 2023-05-13 DIAGNOSIS — F411 Generalized anxiety disorder: Secondary | ICD-10-CM | POA: Diagnosis not present

## 2023-05-31 DIAGNOSIS — R42 Dizziness and giddiness: Secondary | ICD-10-CM | POA: Diagnosis not present

## 2023-05-31 DIAGNOSIS — M542 Cervicalgia: Secondary | ICD-10-CM | POA: Diagnosis not present

## 2023-06-03 DIAGNOSIS — R059 Cough, unspecified: Secondary | ICD-10-CM | POA: Diagnosis not present

## 2023-06-03 DIAGNOSIS — J209 Acute bronchitis, unspecified: Secondary | ICD-10-CM | POA: Diagnosis not present

## 2023-06-06 DIAGNOSIS — F411 Generalized anxiety disorder: Secondary | ICD-10-CM | POA: Diagnosis not present

## 2023-06-07 DIAGNOSIS — F410 Panic disorder [episodic paroxysmal anxiety] without agoraphobia: Secondary | ICD-10-CM | POA: Diagnosis not present

## 2023-06-07 DIAGNOSIS — F900 Attention-deficit hyperactivity disorder, predominantly inattentive type: Secondary | ICD-10-CM | POA: Diagnosis not present

## 2023-06-13 DIAGNOSIS — M799 Soft tissue disorder, unspecified: Secondary | ICD-10-CM | POA: Diagnosis not present

## 2023-06-13 DIAGNOSIS — M6281 Muscle weakness (generalized): Secondary | ICD-10-CM | POA: Diagnosis not present

## 2023-06-13 DIAGNOSIS — M542 Cervicalgia: Secondary | ICD-10-CM | POA: Diagnosis not present

## 2023-06-20 DIAGNOSIS — M799 Soft tissue disorder, unspecified: Secondary | ICD-10-CM | POA: Diagnosis not present

## 2023-06-20 DIAGNOSIS — M542 Cervicalgia: Secondary | ICD-10-CM | POA: Diagnosis not present

## 2023-06-20 DIAGNOSIS — M6281 Muscle weakness (generalized): Secondary | ICD-10-CM | POA: Diagnosis not present

## 2023-06-27 DIAGNOSIS — M542 Cervicalgia: Secondary | ICD-10-CM | POA: Diagnosis not present

## 2023-06-27 DIAGNOSIS — M799 Soft tissue disorder, unspecified: Secondary | ICD-10-CM | POA: Diagnosis not present

## 2023-06-27 DIAGNOSIS — M6281 Muscle weakness (generalized): Secondary | ICD-10-CM | POA: Diagnosis not present

## 2023-06-28 DIAGNOSIS — F419 Anxiety disorder, unspecified: Secondary | ICD-10-CM | POA: Diagnosis not present

## 2023-06-28 DIAGNOSIS — E611 Iron deficiency: Secondary | ICD-10-CM | POA: Diagnosis not present

## 2023-06-28 DIAGNOSIS — Z0000 Encounter for general adult medical examination without abnormal findings: Secondary | ICD-10-CM | POA: Diagnosis not present

## 2023-06-28 DIAGNOSIS — F900 Attention-deficit hyperactivity disorder, predominantly inattentive type: Secondary | ICD-10-CM | POA: Diagnosis not present

## 2023-06-28 DIAGNOSIS — E538 Deficiency of other specified B group vitamins: Secondary | ICD-10-CM | POA: Diagnosis not present

## 2023-07-05 DIAGNOSIS — F900 Attention-deficit hyperactivity disorder, predominantly inattentive type: Secondary | ICD-10-CM | POA: Diagnosis not present

## 2023-07-05 DIAGNOSIS — F410 Panic disorder [episodic paroxysmal anxiety] without agoraphobia: Secondary | ICD-10-CM | POA: Diagnosis not present

## 2023-07-11 DIAGNOSIS — M542 Cervicalgia: Secondary | ICD-10-CM | POA: Diagnosis not present

## 2023-07-11 DIAGNOSIS — M799 Soft tissue disorder, unspecified: Secondary | ICD-10-CM | POA: Diagnosis not present

## 2023-07-11 DIAGNOSIS — M6281 Muscle weakness (generalized): Secondary | ICD-10-CM | POA: Diagnosis not present

## 2023-07-12 DIAGNOSIS — F900 Attention-deficit hyperactivity disorder, predominantly inattentive type: Secondary | ICD-10-CM | POA: Diagnosis not present

## 2023-07-12 DIAGNOSIS — F410 Panic disorder [episodic paroxysmal anxiety] without agoraphobia: Secondary | ICD-10-CM | POA: Diagnosis not present

## 2023-07-19 DIAGNOSIS — F410 Panic disorder [episodic paroxysmal anxiety] without agoraphobia: Secondary | ICD-10-CM | POA: Diagnosis not present

## 2023-07-19 DIAGNOSIS — F900 Attention-deficit hyperactivity disorder, predominantly inattentive type: Secondary | ICD-10-CM | POA: Diagnosis not present

## 2023-08-01 DIAGNOSIS — F900 Attention-deficit hyperactivity disorder, predominantly inattentive type: Secondary | ICD-10-CM | POA: Diagnosis not present

## 2023-08-02 DIAGNOSIS — F410 Panic disorder [episodic paroxysmal anxiety] without agoraphobia: Secondary | ICD-10-CM | POA: Diagnosis not present

## 2023-08-02 DIAGNOSIS — F411 Generalized anxiety disorder: Secondary | ICD-10-CM | POA: Diagnosis not present

## 2023-08-08 DIAGNOSIS — F410 Panic disorder [episodic paroxysmal anxiety] without agoraphobia: Secondary | ICD-10-CM | POA: Diagnosis not present

## 2023-08-08 DIAGNOSIS — F900 Attention-deficit hyperactivity disorder, predominantly inattentive type: Secondary | ICD-10-CM | POA: Diagnosis not present

## 2023-08-15 ENCOUNTER — Encounter (HOSPITAL_COMMUNITY): Payer: Self-pay

## 2023-08-15 ENCOUNTER — Emergency Department (HOSPITAL_COMMUNITY)
Admission: EM | Admit: 2023-08-15 | Discharge: 2023-08-15 | Disposition: A | Discharge status: Home / Self Care | Payer: BC Managed Care – PPO | Attending: Emergency Medicine | Admitting: Emergency Medicine

## 2023-08-15 ENCOUNTER — Other Ambulatory Visit: Payer: Self-pay

## 2023-08-15 DIAGNOSIS — E039 Hypothyroidism, unspecified: Secondary | ICD-10-CM | POA: Diagnosis not present

## 2023-08-15 DIAGNOSIS — Z79899 Other long term (current) drug therapy: Secondary | ICD-10-CM | POA: Insufficient documentation

## 2023-08-15 DIAGNOSIS — F419 Anxiety disorder, unspecified: Secondary | ICD-10-CM | POA: Insufficient documentation

## 2023-08-15 LAB — CBC
HCT: 46.3 % — ABNORMAL HIGH (ref 36.0–46.0)
Hemoglobin: 15.7 g/dL — ABNORMAL HIGH (ref 12.0–15.0)
MCH: 32 pg (ref 26.0–34.0)
MCHC: 33.9 g/dL (ref 30.0–36.0)
MCV: 94.3 fL (ref 80.0–100.0)
Platelets: 229 10*3/uL (ref 150–400)
RBC: 4.91 MIL/uL (ref 3.87–5.11)
RDW: 12 % (ref 11.5–15.5)
WBC: 4.9 10*3/uL (ref 4.0–10.5)
nRBC: 0 % (ref 0.0–0.2)

## 2023-08-15 LAB — COMPREHENSIVE METABOLIC PANEL
ALT: 14 U/L (ref 0–44)
AST: 25 U/L (ref 15–41)
Albumin: 4.1 g/dL (ref 3.5–5.0)
Alkaline Phosphatase: 50 U/L (ref 38–126)
Anion gap: 11 (ref 5–15)
BUN: 8 mg/dL (ref 6–20)
CO2: 21 mmol/L — ABNORMAL LOW (ref 22–32)
Calcium: 9.2 mg/dL (ref 8.9–10.3)
Chloride: 103 mmol/L (ref 98–111)
Creatinine, Ser: 0.79 mg/dL (ref 0.44–1.00)
GFR, Estimated: >60 mL/min (ref >60)
Glucose, Bld: 144 mg/dL — ABNORMAL HIGH (ref 70–99)
Potassium: 4.5 mmol/L (ref 3.5–5.1)
Sodium: 135 mmol/L (ref 135–145)
Total Bilirubin: 0.5 mg/dL (ref <1.2)
Total Protein: 7.5 g/dL (ref 6.5–8.1)

## 2023-08-15 LAB — HCG, SERUM, QUALITATIVE: Preg, Serum: NEGATIVE

## 2023-08-15 LAB — TSH: TSH: 2.453 u[IU]/mL (ref 0.350–4.500)

## 2023-08-15 NOTE — Discharge Instructions (Addendum)
You were seen in the ER for evaluation of your anxiety. Your lab work shows that your hemoglobin is a little concentrated. This could be from dehydration, but given your history of anemia, please make sure that you follow up with your PCP on this. For you anxiety, I likely think this is worsening because your are withdrawling from your Zoloft. My recommendation would be to keep with the same regimen you have and called your psychiatrist ASAP for recommendations. Remember, your synthroid should be taken 30 minutes before other medications and on an empty stomach.  You may have to take your ativan to help you through this transition of medications. If you start to have worsenin anxiety, hallucinations, thoughts or hurting yourself or others, eyes dilated, nausea, vomiting, sweating, feeling agitated, please return to the ER for re-evaluation. If you have any other concerns, new or worsening symptoms, please return to the nearest ER for re-evaluation.    Contact a health care provider if: Your symptoms do not improve or they get worse. Get help right away if: You have worsening confusion, severe headache, chest pain, high fever, seizures, or loss of consciousness. You experience serious side effects of medicine, such as swelling of your face, lips, tongue, or throat. These symptoms may be an emergency. Get help right away. Call 911. Do not wait to see if the symptoms will go away. Do not drive yourself to the hospital. Also, get help right away if: You have serious thoughts about hurting yourself or others. Take one of these steps if you feel like you may hurt yourself or others, or have thoughts about taking your own life: Go to your nearest emergency room. Call 911. Call the National Suicide Prevention Lifeline at (423)540-6567 or 988. This is open 24 hours a day. Text the Crisis Text Line at (817) 402-5189.

## 2023-08-15 NOTE — ED Triage Notes (Signed)
Pt is in the process of transitioning from zoloft to Fibrid, pt has been taking both since 11/16 with a gradual decrease in the zoloft dose, pt started to feel shaking, anxious. Pt called her PCP and they told her to take a break from the Fibrid over the weekend and restart again on Monday but since then pt has started to feel more anxious and shaky

## 2023-08-15 NOTE — ED Provider Notes (Signed)
[place] [facility] Provider Note   CSN: [id number] Arrival date & time: 08/15/23  1238     History Chief Complaint  Patient presents with   Anxiety   Shaking    Alyssa Mccarty is a 32 y.o. female with h/o hypothyroidism,  anxiety and panic attacks presents to the ER for evaluation of panic attacks. The patient reports that she has been on Zoloft for 12 years, however has been having daily panic attacks for the past month. She has been in contact with her psychiatrist who is transitioning her off of Zoloft and onto Vybrid. The patient reports that she was starting to have worsening anxiety 2 weeks ago when she went from 150mg  of Zoloft to 100mg  of Zoloft and 10mg  of Vybrid. She reports that that was two weeks ago. She discussed this with her psychiatrist who was concerned that she was getting too much serotonin -per patient, and told her to stop taking the Vybrid for three days. She stopped and felt like her symptoms mildly improved. She is currently on her fourth day of 50mg  of Zoloft and 10mg  of Vybrid. She reports that she is still having daily panic attacks. Today she woke up and felt anxious and shaky. She does have Ativan to take daily as needed. She took one prior to arrival and reports that she feels back to normal, she was just concerned that she had Serotonin Syndrome after looking it up. She denies any chest pain or SOB. Denies any syncope or diaphoresis. Reports some occasional "brain fog" that is transient since tapering the medications. She denies any SI, HI, or hallucinations.    Anxiety       Home Medications Prior to Admission medications   Medication Sig Start Date End Date Taking? Authorizing Provider  amoxicillin (AMOXIL) 500 MG capsule Take 1 capsule (500 mg total) by mouth 3 (three) times daily. 12/01/22   Garrison, [place] N, FNP  amphetamine-dextroamphetamine (ADDERALL XR) 30 MG 24 hr capsule Take 1 capsule (30 mg total) by mouth  daily. 04/27/18   Eksir, Bo Mcclintock, MD  amphetamine-dextroamphetamine (ADDERALL) 15 MG tablet Take 15 mg by mouth every evening.    [provider]  azithromycin (ZITHROMAX) 500 MG tablet Take 1 tablet (500 mg total) by mouth daily. 12/13/22   Ellsworth Lennox, PA-C  levonorgestrel-ethinyl estradiol (SEASONALE) 0.15-0.03 MG tablet Take 1 tablet by mouth daily. 09/20/22 09/20/23  [provider]  levothyroxine (SYNTHROID, LEVOTHROID) 50 MCG tablet Take 50 mcg by mouth daily before breakfast.    [provider]  LORazepam (ATIVAN) 0.5 MG tablet Take 0.5 mg by mouth daily as needed for anxiety.    [provider]  sertraline (ZOLOFT) 100 MG tablet Take 1 tablet (100 mg total) by mouth daily. 02/27/18   Eksir, Bo Mcclintock, MD      Allergies    Septra [bactrim]    Review of Systems   Review of Systems  Physical Exam Updated Vital Signs BP 112/70 (BP Location: Right Arm)   Pulse (!) 105   Temp 98.2 F (36.8 C) (Oral)   Resp 19   Ht 5\' 6"  (1.676 m)   Wt 65.8 kg   LMP 08/01/2023 (Approximate)   SpO2 99%   BMI 23.40 kg/m  Physical Exam  ED Results / Procedures / Treatments   Labs (all labs ordered are listed, but only abnormal results are displayed) Labs Reviewed  COMPREHENSIVE METABOLIC PANEL - Abnormal; Notable for the following components:  Result Value   CO2 21 (*)    Glucose, Bld 144 (*)    All other components within normal limits  CBC - Abnormal; Notable for the following components:   Hemoglobin 15.7 (*)    HCT 46.3 (*)    All other components within normal limits  HCG, SERUM, QUALITATIVE  TSH    EKG None  Radiology No results found.  Procedures Procedures   Medications Ordered in ED Medications - No data to display  ED Course/ Medical Decision Making/ A&P                               Medical Decision Making Amount and/or Complexity of Data Reviewed Labs: ordered.   32 y.o. female presents to the ER for evaluation  of anxiety. Differential diagnosis includes but is not limited to psych, serotonin syndrome, anxiety, medication reaction, SSRI withdrawal. Vital signs mild tachycardia  . Physical exam as noted above.   I independently reviewed and interpreted the patient's labs. Negative pregnancy. CMP shows mild decrease in bicarb, but normal anion gap. Glucose at 144. CBC shows hemoglobin mildly concentrated, could be from some dehydration. TSH within normal limits.     We discussed the results of the labs/imaging. The plan is ***. We discussed strict return precautions and red flag symptoms. The patient verbalized their understanding and agrees to the plan. The patient is stable and being discharged home in good condition.  Portions of this report may have been transcribed using voice recognition software. Every effort was made to ensure accuracy; however, inadvertent computerized transcription errors may be present.    I discussed this case with my attending physician who cosigned this note including patient's presenting symptoms, physical exam, and planned diagnostics and interventions. Attending physician stated agreement with plan or made changes to plan which were implemented.   Attending physician assessed patient at bedside.  Final Clinical Impression(s) / ED Diagnoses Final diagnoses:  None    Rx / DC Orders ED Discharge Orders     None

## 2023-08-22 DIAGNOSIS — F410 Panic disorder [episodic paroxysmal anxiety] without agoraphobia: Secondary | ICD-10-CM | POA: Diagnosis not present

## 2023-08-22 DIAGNOSIS — F900 Attention-deficit hyperactivity disorder, predominantly inattentive type: Secondary | ICD-10-CM | POA: Diagnosis not present

## 2023-08-29 DIAGNOSIS — F410 Panic disorder [episodic paroxysmal anxiety] without agoraphobia: Secondary | ICD-10-CM | POA: Diagnosis not present

## 2023-08-29 DIAGNOSIS — F900 Attention-deficit hyperactivity disorder, predominantly inattentive type: Secondary | ICD-10-CM | POA: Diagnosis not present

## 2023-08-31 DIAGNOSIS — F411 Generalized anxiety disorder: Secondary | ICD-10-CM | POA: Diagnosis not present

## 2023-08-31 DIAGNOSIS — F410 Panic disorder [episodic paroxysmal anxiety] without agoraphobia: Secondary | ICD-10-CM | POA: Diagnosis not present

## 2023-09-05 ENCOUNTER — Encounter (HOSPITAL_COMMUNITY): Payer: Self-pay

## 2023-09-05 ENCOUNTER — Ambulatory Visit (HOSPITAL_COMMUNITY)
Admission: EM | Admit: 2023-09-05 | Discharge: 2023-09-05 | Disposition: A | Discharge status: Home / Self Care | Payer: BC Managed Care – PPO | Attending: Family Medicine | Admitting: Family Medicine

## 2023-09-05 DIAGNOSIS — R002 Palpitations: Secondary | ICD-10-CM | POA: Insufficient documentation

## 2023-09-05 DIAGNOSIS — Z862 Personal history of diseases of the blood and blood-forming organs and certain disorders involving the immune mechanism: Secondary | ICD-10-CM | POA: Diagnosis not present

## 2023-09-05 LAB — CBC WITH DIFFERENTIAL/PLATELET
Abs Immature Granulocytes: 0.02 10*3/uL (ref 0.00–0.07)
Basophils Absolute: 0 10*3/uL (ref 0.0–0.1)
Basophils Relative: 1 %
Eosinophils Absolute: 0 10*3/uL (ref 0.0–0.5)
Eosinophils Relative: 1 %
HCT: 44.4 % (ref 36.0–46.0)
Hemoglobin: 15.7 g/dL — ABNORMAL HIGH (ref 12.0–15.0)
Immature Granulocytes: 0 %
Lymphocytes Relative: 19 %
Lymphs Abs: 1.2 10*3/uL (ref 0.7–4.0)
MCH: 32.5 pg (ref 26.0–34.0)
MCHC: 35.4 g/dL (ref 30.0–36.0)
MCV: 91.9 fL (ref 80.0–100.0)
Monocytes Absolute: 0.5 10*3/uL (ref 0.1–1.0)
Monocytes Relative: 8 %
Neutro Abs: 4.2 10*3/uL (ref 1.7–7.7)
Neutrophils Relative %: 71 %
Platelets: 262 10*3/uL (ref 150–400)
RBC: 4.83 MIL/uL (ref 3.87–5.11)
RDW: 12 % (ref 11.5–15.5)
WBC: 6 10*3/uL (ref 4.0–10.5)
nRBC: 0 % (ref 0.0–0.2)

## 2023-09-05 LAB — IRON AND TIBC
Iron: 94 ug/dL (ref 28–170)
Saturation Ratios: 26 % (ref 10.4–31.8)
TIBC: 361 ug/dL (ref 250–450)
UIBC: 267 ug/dL

## 2023-09-05 LAB — FERRITIN: Ferritin: 12 ng/mL (ref 11–307)

## 2023-09-05 NOTE — ED Provider Notes (Signed)
[facility]    CSN: [id number] Arrival date & time: 09/05/23  1200      History   Chief Complaint Chief Complaint  Patient presents with   Palpitations    HPI Alyssa Mccarty is a 32 y.o. female.    Palpitations Associated symptoms: no chest pain    Patient is here for palpitations.  For the last several weeks she has been waking up with palpitations and fast heart beat.  She has to take an ativan in order for it to improve, and her HR will still be in the high 90s/100s. .  She is here today b/c she is aggravated and wants to make sure her heart is okay.  No chest pain associated.  She does feel sob with it at times.  She is on her period, and states she has a h/o low iron, and wonders if that is related.  She has thyroid issues, ADHD, and anxiety.  TSH recently normal, as was h/h.  Low ferritin in 06/2023 She has not taken her adhd medication in months.  She did trial a new ssri last month, but stopped that due to side affects.  Has been on the zoloft for many years and that is all she is taking for anxiety/depression at this time.        Past Medical History:  Diagnosis Date   Anxiety    Thyroid disease     Patient Active Problem List   Diagnosis Date Noted   Hashimoto's thyroiditis 12/03/2013   Hypothyroidism 12/01/2013    History reviewed. No pertinent surgical history.  OB History   No obstetric history on file.      Home Medications    Prior to Admission medications   Medication Sig Start Date End Date Taking? Authorizing Provider  amphetamine-dextroamphetamine (ADDERALL XR) 30 MG 24 hr capsule Take 1 capsule (30 mg total) by mouth daily. 04/27/18   Eksir, Bo Mcclintock, MD  amphetamine-dextroamphetamine (ADDERALL) 15 MG tablet Take 15 mg by mouth every evening.    [provider]  levothyroxine (SYNTHROID, LEVOTHROID) 50 MCG tablet Take 50 mcg by mouth daily before breakfast.    [provider]  LORazepam (ATIVAN) 0.5  MG tablet Take 0.5 mg by mouth daily as needed for anxiety.    [provider]  sertraline (ZOLOFT) 100 MG tablet Take 1 tablet (100 mg total) by mouth daily. 02/27/18   Eksir, Bo Mcclintock, MD    Family History Family History  Problem Relation Age of Onset   Hyperlipidemia Father    Hypertension Father    Diabetes Paternal Grandfather     Social History Social History   Tobacco Use   Smoking status: Never    Passive exposure: Never   Smokeless tobacco: Never  Vaping Use   Vaping status: Never Used  Substance Use Topics   Alcohol use: Yes    Comment: social use   Drug use: No     Allergies   Septra [bactrim]   Review of Systems Review of Systems  Constitutional: Negative.   HENT: Negative.    Respiratory: Negative.    Cardiovascular:  Positive for palpitations. Negative for chest pain.  Gastrointestinal: Negative.   Genitourinary: Negative.   Musculoskeletal: Negative.   Psychiatric/Behavioral: Negative.       Physical Exam Triage Vital Signs ED Triage Vitals  Encounter Vitals Group     BP 09/05/23 1209 101/79     Systolic BP Percentile --      Diastolic BP  Percentile --      Pulse Rate 09/05/23 1209 97     Resp 09/05/23 1209 18     Temp 09/05/23 1209 97.9 F (36.6 C)     Temp Source 09/05/23 1209 Oral     SpO2 09/05/23 1209 100 %     Weight --      Height --      Head Circumference --      Peak Flow --      Pain Score 09/05/23 1210 0     Pain Loc --      Pain Education --      Exclude from Growth Chart --    No data found.  Updated Vital Signs BP 101/79 (BP Location: Right Arm)   Pulse 97   Temp 97.9 F (36.6 C) (Oral)   Resp 18   LMP 08/01/2023 (Approximate)   SpO2 100%   Visual Acuity Right Eye Distance:   Left Eye Distance:   Bilateral Distance:    Right Eye Near:   Left Eye Near:    Bilateral Near:     Physical Exam Constitutional:      General: She is not in acute distress.    Appearance: Normal appearance. She  is normal weight. She is not ill-appearing.  Cardiovascular:     Rate and Rhythm: Normal rate and regular rhythm.  Pulmonary:     Breath sounds: Normal breath sounds.  Musculoskeletal:     Cervical back: Normal range of motion and neck supple.  Skin:    General: Skin is warm.  Neurological:     General: No focal deficit present.     Mental Status: She is alert.  Psychiatric:        Mood and Affect: Mood normal.      UC Treatments / Results  Labs (all labs ordered are listed, but only abnormal results are displayed) Labs Reviewed - No data to display  EKG NSR;  no acute changes  Radiology No results found.  Procedures Procedures (including critical care time)  Medications Ordered in UC Medications - No data to display  Initial Impression / Assessment and Plan / UC Course  I have reviewed the triage vital signs and the nursing notes.  Pertinent labs & imaging results that were available during my care of the patient were reviewed by me and considered in my medical decision making (see chart for details).   Final Clinical Impressions(s) / UC Diagnoses   Final diagnoses:  Palpitations  History of anemia     Discharge Instructions      You were seen today for palpitations.  Your EKG looked okay today.  I have ordered blood work today to make sure there is no other major cause for your symptoms.  This will be resulted tomorrow and you will see your results on mychart.  If there is anything abnormal you will be notified for discussion.  In the mean time, you should continue to monitor your symptoms.  If you have continued palpitation with fast heart beat that does not resolve, or you have chest pain and shortness of breath, then please go to the ER for further evaluation.     ED Prescriptions   None    PDMP not reviewed this encounter.   Jannifer Franklin, MD 09/05/23 [phone]

## 2023-09-05 NOTE — ED Triage Notes (Addendum)
Pt states having rapid heartbeat for over 2wks. States having these episodes everyday. States feels its getting worse. C/o sweaty at night and having SOB. States hx of low iron and having a heavy menstrual cycle this week. States has a cardiologist appt next Friday. States took ativan at [facility] with some relief.

## 2023-09-05 NOTE — Discharge Instructions (Addendum)
You were seen today for palpitations.  Your EKG looked okay today.  I have ordered blood work today to make sure there is no other major cause for your symptoms.  This will be resulted tomorrow and you will see your results on mychart.  If there is anything abnormal you will be notified for discussion.  In the mean time, you should continue to monitor your symptoms.  If you have continued palpitation with fast heart beat that does not resolve, or you have chest pain and shortness of breath, then please go to the ER for further evaluation.

## 2023-09-13 DIAGNOSIS — E063 Autoimmune thyroiditis: Secondary | ICD-10-CM | POA: Diagnosis not present

## 2023-09-13 DIAGNOSIS — E782 Mixed hyperlipidemia: Secondary | ICD-10-CM | POA: Diagnosis not present

## 2023-09-13 DIAGNOSIS — E039 Hypothyroidism, unspecified: Secondary | ICD-10-CM | POA: Diagnosis not present

## 2023-09-13 DIAGNOSIS — R002 Palpitations: Secondary | ICD-10-CM | POA: Diagnosis not present

## 2023-09-17 ENCOUNTER — Encounter: Payer: Self-pay | Admitting: Neurology

## 2023-09-20 DIAGNOSIS — F900 Attention-deficit hyperactivity disorder, predominantly inattentive type: Secondary | ICD-10-CM | POA: Diagnosis not present

## 2023-09-20 DIAGNOSIS — F410 Panic disorder [episodic paroxysmal anxiety] without agoraphobia: Secondary | ICD-10-CM | POA: Diagnosis not present

## 2023-09-25 DIAGNOSIS — F411 Generalized anxiety disorder: Secondary | ICD-10-CM | POA: Diagnosis not present

## 2023-09-25 DIAGNOSIS — F410 Panic disorder [episodic paroxysmal anxiety] without agoraphobia: Secondary | ICD-10-CM | POA: Diagnosis not present

## 2023-09-26 DIAGNOSIS — F410 Panic disorder [episodic paroxysmal anxiety] without agoraphobia: Secondary | ICD-10-CM | POA: Diagnosis not present

## 2023-09-26 DIAGNOSIS — F900 Attention-deficit hyperactivity disorder, predominantly inattentive type: Secondary | ICD-10-CM | POA: Diagnosis not present

## 2023-09-26 NOTE — Progress Notes (Deleted)
 Initial neurology clinic note  Sydney Hasten MRN: [id number] DOB: 1991-08-29  Referring provider: Gwenlyn Found, MD  Primary care provider: Gwenlyn Found, MD  Reason for consult:  dizziness  Subjective:  This is Ms. Elmina Hendel, a 33 y.o. ***-handed female with a medical history of Hashimoto's thyroiditis, iron deficiency, B12 deficiency, ADHD, and anxiety*** who presents to neurology clinic with ***. The patient is accompanied by ***.  *** Dizziness, different from prior lightheadedness Now more vetiginous Attributes to botox on 7/19 and 8/1 Has seen ENT Had MRI brain  Per PCP note, tympanic membrane is scarred on right; middle ear effusion on left with scarred and bulging tympanic membrane  Had an episode of right cheeck tingling and right otalgia and right lateral neck discomfort Thought to be MSK in nature Seeing PT to help Had spine imaging  Seeing psych now for anxiety, on zoloft On Adderall for ADHD  MEDICATIONS:  Outpatient Encounter Medications as of 10/09/2023  Medication Sig   amphetamine-dextroamphetamine (ADDERALL XR) 30 MG 24 hr capsule Take 1 capsule (30 mg total) by mouth daily.   amphetamine-dextroamphetamine (ADDERALL) 15 MG tablet Take 15 mg by mouth every evening.   levothyroxine (SYNTHROID, LEVOTHROID) 50 MCG tablet Take 50 mcg by mouth daily before breakfast.   LORazepam (ATIVAN) 0.5 MG tablet Take 0.5 mg by mouth daily as needed for anxiety.   sertraline (ZOLOFT) 100 MG tablet Take 1 tablet (100 mg total) by mouth daily.   No facility-administered encounter medications on file as of 10/09/2023.    PAST MEDICAL HISTORY: Past Medical History:  Diagnosis Date   Anxiety    Thyroid disease     PAST SURGICAL HISTORY: No past surgical history on file.  ALLERGIES: Allergies  Allergen Reactions   Septra [Bactrim] Swelling    FACE SWELLING    FAMILY HISTORY: Family History  Problem Relation Age of Onset   Hyperlipidemia Father     Hypertension Father    Diabetes Paternal Grandfather     SOCIAL HISTORY: Social History   Tobacco Use   Smoking status: Never    Passive exposure: Never   Smokeless tobacco: Never  Vaping Use   Vaping status: Never Used  Substance Use Topics   Alcohol use: Yes    Comment: social use   Drug use: No   Social History   Social History Narrative   Not on file    Objective:  Vital Signs:  There were no vitals taken for this visit.  ***  Labs and Imaging review: Internal labs: 09/05/23: Ferritin 12 CBC w/ diff unremarkable  08/15/23: TSH wnl CMP significant for glucose of 144 ***  External labs: *** 05/14/23: HbA1c: 5.3 Lipid panel: tChol 259, LDL 166, TG 222  03/12/23: Y86: 243  Imaging: MRI brain wo contrast (04/04/23): FINDINGS:  A routine protocol non-contrast brain MRI was ordered and performed.   Intermittently motion degraded examination (with up to moderate  motion degradation of the acquired sequences). Within this  limitation, findings are as follows.   Brain:   Cerebral volume is normal.   No cortical encephalomalacia is identified. No significant cerebral  white matter disease.   There is no acute infarct.   No evidence of an intracranial mass.   No chronic intracranial blood products.   No extra-axial fluid collection.   No midline shift.   Vascular: Maintained flow voids within the proximal large arterial  vessels.   Skull and upper cervical spine: No suspicious marrow lesion.  Sinuses/Orbits: No mass or acute finding within the imaged orbits.  Trace mucosal thickening within the bilateral frontal sinuses. Mild  mucosal thickening scattered within bilateral ethmoid air cells.  Trace mucosal thickening within the right sphenoid sinus.   IMPRESSION:  1. Intermittently motion degraded examination.  2. Within this limitation, there is an unremarkable non-contrast MRI  appearance of the brain. No evidence of an acute  intracranial  abnormality. If the patient's facial pain persists, consider a  trigeminal nerve protocol MRI of the face (with and without  contrast) for further evaluation.  3. Mild paranasal sinus disease as described.   MRI cervical spine wo contrast (04/04/23): FINDINGS:  Intermittently motion degraded examination. Most notably, the axial  T2 GRE sequence is moderate to severely motion degraded. Within this  limitation, findings are as follows.   Alignment: No significant spondylolisthesis.   Vertebrae: Vertebral body height is maintained. No significant  marrow edema or focal suspicious osseous lesion.   Cord: Within limitations of motion degradation, no signal  abnormality is identified within the cervical spinal cord.   Posterior Fossa, vertebral arteries, paraspinal tissues: Posterior  fossa assessed on same-day brain MRI. Flow voids preserved within  the imaged cervical vertebral arteries. No paraspinal mass or  collection.   Disc levels:   Mild multilevel disc degeneration within the cervical spine, and at  the T1-T2 level. Mild to moderate disc degeneration at T2-T3.   C2-C3: No significant disc herniation or stenosis.   C3-C4: Slight disc bulge. No significant spinal canal or foraminal  stenosis.   C4-C5: No significant disc herniation or stenosis.   C5-C6: Slight disc bulge. No significant spinal canal or foraminal  stenosis.   C6-C7: Slight disc bulge. No significant spinal canal or foraminal  stenosis.   C7-T1: No significant disc herniation or stenosis.   T1-T2: Slight disc bulge. No significant spinal canal or foraminal  stenosis.   T2-T3: This level is imaged in the sagittal plane only. A small  central disc protrusion mildly effaces the ventral thecal sac. No  significant foraminal stenosis.   IMPRESSION:  1. Motion degraded examination.  2. Spondylosis at the cervical and visualized upper thoracic levels  as outlined. No more than mild relative  spinal canal narrowing. No  significant foraminal stenosis. No more than mild disc degeneration  within the cervical spine. Mild-to-moderate disc degeneration at  T2-T3.   Assessment/Plan:  Tymesha Ditmore is a 33 y.o. female who presents for evaluation of ***. *** has a relevant medical history of ***. *** neurological examination is pertinent for ***. Available diagnostic data is significant for ***. This constellation of symptoms and objective data would most likely localize to ***. ***  PLAN: -Blood work: *** ***B12 supplementation  -Return to clinic ***  The impression above as well as the plan as outlined below were extensively discussed with the patient (in the company of ***) who voiced understanding. All questions were answered to their satisfaction.  The patient was counseled on pertinent fall precautions per the printed material provided today, and as noted under the "Patient Instructions" section below.***  When available, results of the above investigations and possible further recommendations will be communicated to the patient via telephone/MyChart. Patient to call office if not contacted after expected testing turnaround time.   Total time spent reviewing records, interview, history/exam, documentation, and coordination of care on day of encounter:  *** min   Thank you for allowing me to participate in patient's care.  If I can answer any  additional questions, I would be pleased to do so.  Jacquelyne Balint, MD   CC: Gwenlyn Found, MD [address] [place] [address]  CC: Referring provider: Gwenlyn Found, MD [address] [place],  [place] [address]

## 2023-09-30 DIAGNOSIS — F411 Generalized anxiety disorder: Secondary | ICD-10-CM | POA: Diagnosis not present

## 2023-09-30 DIAGNOSIS — F410 Panic disorder [episodic paroxysmal anxiety] without agoraphobia: Secondary | ICD-10-CM | POA: Diagnosis not present

## 2023-10-03 DIAGNOSIS — F900 Attention-deficit hyperactivity disorder, predominantly inattentive type: Secondary | ICD-10-CM | POA: Diagnosis not present

## 2023-10-03 DIAGNOSIS — F410 Panic disorder [episodic paroxysmal anxiety] without agoraphobia: Secondary | ICD-10-CM | POA: Diagnosis not present

## 2023-10-07 ENCOUNTER — Ambulatory Visit: Payer: BC Managed Care – PPO | Admitting: Cardiovascular Disease

## 2023-10-07 DIAGNOSIS — F410 Panic disorder [episodic paroxysmal anxiety] without agoraphobia: Secondary | ICD-10-CM | POA: Diagnosis not present

## 2023-10-07 DIAGNOSIS — F411 Generalized anxiety disorder: Secondary | ICD-10-CM | POA: Diagnosis not present

## 2023-10-07 DIAGNOSIS — F4322 Adjustment disorder with anxiety: Secondary | ICD-10-CM | POA: Diagnosis not present

## 2023-10-09 ENCOUNTER — Ambulatory Visit: Payer: BC Managed Care – PPO | Admitting: Neurology

## 2023-10-11 DIAGNOSIS — F410 Panic disorder [episodic paroxysmal anxiety] without agoraphobia: Secondary | ICD-10-CM | POA: Diagnosis not present

## 2023-10-11 DIAGNOSIS — F900 Attention-deficit hyperactivity disorder, predominantly inattentive type: Secondary | ICD-10-CM | POA: Diagnosis not present

## 2023-10-18 DIAGNOSIS — F410 Panic disorder [episodic paroxysmal anxiety] without agoraphobia: Secondary | ICD-10-CM | POA: Diagnosis not present

## 2023-10-18 DIAGNOSIS — F900 Attention-deficit hyperactivity disorder, predominantly inattentive type: Secondary | ICD-10-CM | POA: Diagnosis not present

## 2023-10-25 DIAGNOSIS — F900 Attention-deficit hyperactivity disorder, predominantly inattentive type: Secondary | ICD-10-CM | POA: Diagnosis not present

## 2023-10-25 DIAGNOSIS — F410 Panic disorder [episodic paroxysmal anxiety] without agoraphobia: Secondary | ICD-10-CM | POA: Diagnosis not present

## 2023-11-01 DIAGNOSIS — F410 Panic disorder [episodic paroxysmal anxiety] without agoraphobia: Secondary | ICD-10-CM | POA: Diagnosis not present

## 2023-11-01 DIAGNOSIS — F411 Generalized anxiety disorder: Secondary | ICD-10-CM | POA: Diagnosis not present

## 2023-11-07 DIAGNOSIS — F410 Panic disorder [episodic paroxysmal anxiety] without agoraphobia: Secondary | ICD-10-CM | POA: Diagnosis not present

## 2023-11-07 DIAGNOSIS — F900 Attention-deficit hyperactivity disorder, predominantly inattentive type: Secondary | ICD-10-CM | POA: Diagnosis not present

## 2023-11-09 NOTE — Progress Notes (Deleted)
 Cardiology Office Note:    Date:  11/09/2023   ID:  Alyssa Mccarty, DOB Dec 22, 1990, MRN [id number]  PCP:  Gwenlyn Found, MD   [facility] Providers Cardiologist:  None { Click to update primary MD,subspecialty MD or APP then REFRESH:1}    Referring MD: Gwenlyn Found, MD   No chief complaint on file. ***  History of Present Illness:    Alyssa Mccarty is a 33 y.o. female is self referred for evaluation of palpitations/dizziness/   Past Medical History:  Diagnosis Date   Anxiety    Thyroid disease     No past surgical history on file.  Current Medications: No outpatient medications have been marked as taking for the 11/15/23 encounter (Appointment) with [place], Nabria Nevin M, MD.     Allergies:   Septra [bactrim]   Social History   Socioeconomic History   Marital status: Single    Spouse name: Not on file   Number of children: Not on file   Years of education: Not on file   Highest education level: Not on file  Occupational History   Not on file  Tobacco Use   Smoking status: Never    Passive exposure: Never   Smokeless tobacco: Never  Vaping Use   Vaping status: Never Used  Substance and Sexual Activity   Alcohol use: Yes    Comment: social use   Drug use: No   Sexual activity: Yes    Birth control/protection: None  Other Topics Concern   Not on file  Social History Narrative   Not on file   Social Drivers of Health   Financial Resource Strain: Not on file  Food Insecurity: Low Risk  (12/21/2022)   Received from [facility]   Hunger Vital Sign    Worried About Running Out of Food in the Last Year: Never true    Ran Out of Food in the Last Year: Never true  Transportation Needs: No Transportation Needs (12/21/2022)   Received from [facility]    In the past 12 months, has lack of reliable transportation kept you from medical appointments, meetings, work or from getting things needed for daily living? : No  Physical  Activity: Not on file  Stress: Not on file  Social Connections: Not on file     Family History: The patient's ***family history includes Diabetes in her paternal grandfather; Hyperlipidemia in her father; Hypertension in her father.  ROS:   Please see the history of present illness.    *** All other systems reviewed and are negative.  EKGs/Labs/Other Studies Reviewed:    The following studies were reviewed today: ***      Recent Labs: 08/15/2023: ALT 14; BUN 8; Creatinine, Ser 0.79; Potassium 4.5; Sodium 135; TSH 2.453 09/05/2023: Hemoglobin 15.7; Platelets 262  Recent Lipid Panel No results found for: "CHOL", "TRIG", "HDL", "CHOLHDL", "VLDL", "LDLCALC", "LDLDIRECT"   Risk Assessment/Calculations:   {Does this patient have ATRIAL FIBRILLATION?:[phone]}  No BP recorded.  {Refresh Note OR Click here to enter BP  :1}***         Physical Exam:    VS:  There were no vitals taken for this visit.    Wt Readings from Last 3 Encounters:  08/15/23 145 lb (65.8 kg)  12/01/22 148 lb (67.1 kg)  06/26/19 145 lb (65.8 kg)     GEN: *** Well nourished, well developed in no acute distress HEENT: Normal NECK: No JVD; No carotid bruits LYMPHATICS: No lymphadenopathy CARDIAC: ***  RRR, no murmurs, rubs, gallops RESPIRATORY:  Clear to auscultation without rales, wheezing or rhonchi  ABDOMEN: Soft, non-tender, non-distended MUSCULOSKELETAL:  No edema; No deformity  SKIN: Warm and dry NEUROLOGIC:  Alert and oriented x 3 PSYCHIATRIC:  Normal affect   ASSESSMENT:    No diagnosis found. PLAN:    In order of problems listed above:  ***      {Are you ordering a CV Procedure (e.g. stress test, cath, DCCV, TEE, etc)?   Press F2        :[id number]}    Medication Adjustments/Labs and Tests Ordered: Current medicines are reviewed at length with the patient today.  Concerns regarding medicines are outlined above.  No orders of the defined types were placed in this encounter.  No  orders of the defined types were placed in this encounter.   There are no Patient Instructions on file for this visit.   Signed, Cory Kitt [place], MD  11/09/2023 8:41 AM    [facility]

## 2023-11-14 ENCOUNTER — Telehealth: Payer: Self-pay | Admitting: Cardiovascular Disease

## 2023-11-14 DIAGNOSIS — F410 Panic disorder [episodic paroxysmal anxiety] without agoraphobia: Secondary | ICD-10-CM | POA: Diagnosis not present

## 2023-11-14 DIAGNOSIS — F900 Attention-deficit hyperactivity disorder, predominantly inattentive type: Secondary | ICD-10-CM | POA: Diagnosis not present

## 2023-11-14 NOTE — Telephone Encounter (Signed)
 Patient called and wanted to know if she can take her anxiety medications tomorrow before coming to appt. 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

## 2023-11-14 NOTE — Telephone Encounter (Signed)
 [place], Peter M, MD  You3 minutes ago (3:11 PM)    Yes that is fine  Peter [place] MD, [facility]   Patient identification verified by 2 forms. Marilynn Rail, RN   Called and spoke to patient  Relayed provider message  Patient verbalized understanding, no questions at this time

## 2023-11-15 ENCOUNTER — Ambulatory Visit: Payer: BC Managed Care – PPO | Admitting: Cardiology

## 2023-11-20 ENCOUNTER — Ambulatory Visit: Payer: Self-pay | Admitting: Internal Medicine

## 2023-11-21 DIAGNOSIS — F410 Panic disorder [episodic paroxysmal anxiety] without agoraphobia: Secondary | ICD-10-CM | POA: Diagnosis not present

## 2023-11-21 DIAGNOSIS — F900 Attention-deficit hyperactivity disorder, predominantly inattentive type: Secondary | ICD-10-CM | POA: Diagnosis not present

## 2023-11-28 DIAGNOSIS — F410 Panic disorder [episodic paroxysmal anxiety] without agoraphobia: Secondary | ICD-10-CM | POA: Diagnosis not present

## 2023-11-28 DIAGNOSIS — F900 Attention-deficit hyperactivity disorder, predominantly inattentive type: Secondary | ICD-10-CM | POA: Diagnosis not present

## 2023-12-05 ENCOUNTER — Ambulatory Visit: Payer: Self-pay | Admitting: Allergy & Immunology

## 2023-12-05 DIAGNOSIS — F410 Panic disorder [episodic paroxysmal anxiety] without agoraphobia: Secondary | ICD-10-CM | POA: Diagnosis not present

## 2023-12-05 DIAGNOSIS — F900 Attention-deficit hyperactivity disorder, predominantly inattentive type: Secondary | ICD-10-CM | POA: Diagnosis not present

## 2023-12-05 DIAGNOSIS — F4001 Agoraphobia with panic disorder: Secondary | ICD-10-CM | POA: Diagnosis not present

## 2023-12-18 DIAGNOSIS — H52203 Unspecified astigmatism, bilateral: Secondary | ICD-10-CM | POA: Diagnosis not present

## 2023-12-18 DIAGNOSIS — H5213 Myopia, bilateral: Secondary | ICD-10-CM | POA: Diagnosis not present

## 2023-12-18 DIAGNOSIS — R519 Headache, unspecified: Secondary | ICD-10-CM | POA: Diagnosis not present

## 2023-12-19 DIAGNOSIS — F410 Panic disorder [episodic paroxysmal anxiety] without agoraphobia: Secondary | ICD-10-CM | POA: Diagnosis not present

## 2023-12-19 DIAGNOSIS — F4001 Agoraphobia with panic disorder: Secondary | ICD-10-CM | POA: Diagnosis not present

## 2023-12-20 DIAGNOSIS — F410 Panic disorder [episodic paroxysmal anxiety] without agoraphobia: Secondary | ICD-10-CM | POA: Diagnosis not present

## 2023-12-20 DIAGNOSIS — F900 Attention-deficit hyperactivity disorder, predominantly inattentive type: Secondary | ICD-10-CM | POA: Diagnosis not present

## 2023-12-26 DIAGNOSIS — F900 Attention-deficit hyperactivity disorder, predominantly inattentive type: Secondary | ICD-10-CM | POA: Diagnosis not present

## 2023-12-26 DIAGNOSIS — F410 Panic disorder [episodic paroxysmal anxiety] without agoraphobia: Secondary | ICD-10-CM | POA: Diagnosis not present

## 2024-01-01 DIAGNOSIS — F900 Attention-deficit hyperactivity disorder, predominantly inattentive type: Secondary | ICD-10-CM | POA: Diagnosis not present

## 2024-01-01 DIAGNOSIS — F410 Panic disorder [episodic paroxysmal anxiety] without agoraphobia: Secondary | ICD-10-CM | POA: Diagnosis not present

## 2024-01-09 DIAGNOSIS — Z5181 Encounter for therapeutic drug level monitoring: Secondary | ICD-10-CM | POA: Diagnosis not present

## 2024-01-09 DIAGNOSIS — E049 Nontoxic goiter, unspecified: Secondary | ICD-10-CM | POA: Diagnosis not present

## 2024-01-09 DIAGNOSIS — E611 Iron deficiency: Secondary | ICD-10-CM | POA: Diagnosis not present

## 2024-01-09 DIAGNOSIS — F900 Attention-deficit hyperactivity disorder, predominantly inattentive type: Secondary | ICD-10-CM | POA: Diagnosis not present

## 2024-01-09 DIAGNOSIS — E538 Deficiency of other specified B group vitamins: Secondary | ICD-10-CM | POA: Diagnosis not present

## 2024-01-09 DIAGNOSIS — F410 Panic disorder [episodic paroxysmal anxiety] without agoraphobia: Secondary | ICD-10-CM | POA: Diagnosis not present

## 2024-01-16 DIAGNOSIS — F410 Panic disorder [episodic paroxysmal anxiety] without agoraphobia: Secondary | ICD-10-CM | POA: Diagnosis not present

## 2024-01-16 DIAGNOSIS — F900 Attention-deficit hyperactivity disorder, predominantly inattentive type: Secondary | ICD-10-CM | POA: Diagnosis not present

## 2024-01-21 DIAGNOSIS — F411 Generalized anxiety disorder: Secondary | ICD-10-CM | POA: Diagnosis not present

## 2024-01-23 DIAGNOSIS — F410 Panic disorder [episodic paroxysmal anxiety] without agoraphobia: Secondary | ICD-10-CM | POA: Diagnosis not present

## 2024-01-23 DIAGNOSIS — F900 Attention-deficit hyperactivity disorder, predominantly inattentive type: Secondary | ICD-10-CM | POA: Diagnosis not present

## 2024-01-27 ENCOUNTER — Ambulatory Visit: Payer: BC Managed Care – PPO | Attending: Internal Medicine | Admitting: Internal Medicine

## 2024-01-27 VITALS — BP 113/81 | HR 109 | Ht 66.0 in | Wt 159.0 lb

## 2024-01-27 DIAGNOSIS — R002 Palpitations: Secondary | ICD-10-CM | POA: Diagnosis not present

## 2024-01-27 DIAGNOSIS — E063 Autoimmune thyroiditis: Secondary | ICD-10-CM | POA: Diagnosis not present

## 2024-01-27 DIAGNOSIS — Z8342 Family history of familial hypercholesterolemia: Secondary | ICD-10-CM | POA: Diagnosis not present

## 2024-01-27 MED ORDER — PROPRANOLOL HCL 10 MG PO TABS: 10.0000 mg | ORAL_TABLET | Freq: Every day | ORAL | 3 refills | Status: AC

## 2024-01-27 NOTE — Progress Notes (Signed)
 Cardiology Office Note:  .    Date:  01/27/2024  ID:  Alyssa Mccarty, DOB 1991-04-12, MRN [id number] PCP: Audria Leather, MD  [facility] Providers Cardiologist:  Jann Melody, MD     CC: tachycardia evaluation Consulted for the evaluation of IST vs POTS at the behest of Dr. Milton Alpers   History of Present Illness: .    Alyssa Mccarty is a 33 y.o. female who presents for a second opinion regarding possible dysautonomia or inappropriate sinus tachycardia. She was referred by her primary care doctor for evaluation of high cholesterol and elevated heart rate.  She has a history of elevated heart rate, particularly when standing, with increases of up to 30 bpm upon standing, reaching up to 140 bpm. She experiences frequent dizziness but no syncope. Her current heart rate is 109 bpm. She uses propranolol 5 mg as needed, prescribed by her psychiatrist, but is cautious about increasing the dose due to concerns about hypotension.  She has a history of hypothyroidism with recent changes to her levothyroxine dosage and is concerned that this may be contributing to her elevated heart rate.  She was initially referred to a cardiologist for high cholesterol, which was discovered during routine testing, with an LDL cholesterol level of 160 mg/dL. She is concerned about familial hypercholesterolemia due to her father's high blood pressure and elevated cholesterol, and her grandmother's use of a beta blocker for a rhythm issue.  She experiences significant anxiety and panic attacks, which have worsened since receiving Botox in August, impacting her ability to work and resulting in her being on leave. She was previously well-controlled on Zoloft  for anxiety. Her anxiety-related symptoms include a racing heart, which she feels are exacerbated by her current health issues.  Relevant histories: .  Social - no FH of early CAD ROS: As per HPI.   Physical Exam:    VS:  BP 113/81 (BP Location:  Left Arm)   Pulse (!) 109   Ht 5\' 6"  (1.676 m)   Wt 159 lb (72.1 kg)   SpO2 97%   BMI 25.66 kg/m    Wt Readings from Last 3 Encounters:  01/27/24 159 lb (72.1 kg)  08/15/23 145 lb (65.8 kg)  12/01/22 148 lb (67.1 kg)    Gen: no distress   Neck: No JVD Cardiac: No Rubs or Gallops, no murmur, regular tachycardia +2 radial pulses Respiratory: Clear to auscultation bilaterally, normal effort, normal  respiratory rate GI: Soft, nontender, non-distended  MS: No  edema;  moves all extremities Integument: Skin feels warm Neuro:  At time of evaluation, alert and oriented to person/place/time/situation  Psych: Normal affect, patient feels ok   ASSESSMENT AND PLAN: .     An EKG was ordered for tachycardia and shows Sinus tachycardia  Inappropriate sinus tachycardia Elevated heart rate with episodes reaching 140-145 bpm, particularly upon standing. Differential includes inappropriate sinus tachycardia and postural orthostatic tachycardia syndrome (POTS). Previous heart monitoring showed sinus rhythm without abnormal foci. Current management includes propranolol, with concerns about hypotension and bradycardia. Discussed increasing propranolol dose to manage symptoms more effectively. Emphasized the importance of POTS exercises to improve cardiovascular conditioning and potentially reduce symptoms. - Increase propranolol dose from 5 mg to 10 mg as needed - Provide POTS exercise regimen to improve cardiovascular conditioning (she previously tried the calendar but did not complete it)  Familial hypercholesterolemia Elevated cholesterol levels with LDL at 160 mg/dL. Family history suggests potential familial hypercholesterolemia. Discussed the importance of lifestyle modifications including a  predominantly plant-based diet, regular exercise, and avoiding smoking. Discussed the potential need for medication based on further lab results, specifically lipoprotein(a) levels, to assess thrombotic risk.  Emphasized the importance of early intervention to prevent future cardiovascular events, considering her young age and familial risk factors. - Order lipoprotein(a) test at [facility] or in-office - Discuss potential initiation of low-intensity statin therapy based on lipoprotein(a) results - Emphasize lifestyle modifications including diet and exercise - Follow-up in six months to reassess cholesterol management  Hypothyroidism Hypothyroidism with recent medication adjustments. Potential contribution to elevated heart rate considered. Monitoring of thyroid  function and symptoms is ongoing to ensure appropriate management.  General Health Maintenance Discussed the importance of maintaining a healthy lifestyle to manage cardiovascular risk factors. - Encourage regular exercise and a balanced diet - Avoid smoking and manage stress effectively  Fall f/u with my team.    Gloriann Larger, MD [facility] Cardiologist [facility]  [address] [place], [address] [place], [place] [address] [phone]  12:59 PM

## 2024-01-27 NOTE — Patient Instructions (Signed)
 Medication Instructions:  Your physician has recommended you make the following change in your medication:  INCREASE: Propranolol (Inderal) to 10 mg by mouth once daily  *If you need a refill on your cardiac medications before your next appointment, please call your pharmacy*  Lab Work: Lpa   If you have labs (blood work) drawn today and your tests are completely normal, you will receive your results only by: MyChart Message (if you have MyChart) OR A paper copy in the mail If you have any lab test that is abnormal or we need to change your treatment, we will call you to review the results.  Testing/Procedures: NONE  Follow-Up: At [facility], you and your health needs are our priority.  As part of our continuing mission to provide you with exceptional heart care, our providers are all part of one team.  This team includes your primary Cardiologist (physician) and Advanced Practice Providers or APPs (Physician Assistants and Nurse Practitioners) who all work together to provide you with the care you need, when you need it.  Your next appointment:   6 month(s)  Provider:   One of our Advanced Practice Providers (APPs): Melita Springer, PA-C  Friddie Jetty, NP Evaline Hill, NP  Theotis Flake, PA-C Lawana Pray, NP  Willis Harter, PA-C Lovette Rud, PA-C  [place] Charles Connor, NP  Marlana Silvan, NP Marcie Sever, PA-C  Laquita Plant, PA-C    Dayna Dunn, PA-C  Marlyse Single, PA-C Palmer Bobo, NP Katlyn West, NP Callie Goodrich, PA-C  Evan Williams, PA-C Sheng Haley, PA-C  Xika Zhao, NP Kathleen Johnson, PA-C

## 2024-01-29 ENCOUNTER — Ambulatory Visit: Payer: Self-pay

## 2024-01-29 DIAGNOSIS — Z8342 Family history of familial hypercholesterolemia: Secondary | ICD-10-CM

## 2024-01-29 LAB — LIPOPROTEIN A (LPA): Lipoprotein (a): 20.8 nmol/L (ref <75.0)

## 2024-01-30 DIAGNOSIS — F900 Attention-deficit hyperactivity disorder, predominantly inattentive type: Secondary | ICD-10-CM | POA: Diagnosis not present

## 2024-01-30 DIAGNOSIS — F410 Panic disorder [episodic paroxysmal anxiety] without agoraphobia: Secondary | ICD-10-CM | POA: Diagnosis not present

## 2024-02-06 MED ORDER — ROSUVASTATIN CALCIUM 5 MG PO TABS: 5.0000 mg | ORAL_TABLET | Freq: Every day | ORAL | 3 refills | Status: DC

## 2024-02-07 DIAGNOSIS — F900 Attention-deficit hyperactivity disorder, predominantly inattentive type: Secondary | ICD-10-CM | POA: Diagnosis not present

## 2024-02-07 DIAGNOSIS — F410 Panic disorder [episodic paroxysmal anxiety] without agoraphobia: Secondary | ICD-10-CM | POA: Diagnosis not present

## 2024-02-13 DIAGNOSIS — F410 Panic disorder [episodic paroxysmal anxiety] without agoraphobia: Secondary | ICD-10-CM | POA: Diagnosis not present

## 2024-02-13 DIAGNOSIS — F900 Attention-deficit hyperactivity disorder, predominantly inattentive type: Secondary | ICD-10-CM | POA: Diagnosis not present

## 2024-02-20 DIAGNOSIS — F410 Panic disorder [episodic paroxysmal anxiety] without agoraphobia: Secondary | ICD-10-CM | POA: Diagnosis not present

## 2024-02-20 DIAGNOSIS — F900 Attention-deficit hyperactivity disorder, predominantly inattentive type: Secondary | ICD-10-CM | POA: Diagnosis not present

## 2024-02-27 DIAGNOSIS — F410 Panic disorder [episodic paroxysmal anxiety] without agoraphobia: Secondary | ICD-10-CM | POA: Diagnosis not present

## 2024-02-27 DIAGNOSIS — F900 Attention-deficit hyperactivity disorder, predominantly inattentive type: Secondary | ICD-10-CM | POA: Diagnosis not present

## 2024-03-05 DIAGNOSIS — E611 Iron deficiency: Secondary | ICD-10-CM | POA: Diagnosis not present

## 2024-03-05 DIAGNOSIS — E049 Nontoxic goiter, unspecified: Secondary | ICD-10-CM | POA: Diagnosis not present

## 2024-03-05 DIAGNOSIS — E063 Autoimmune thyroiditis: Secondary | ICD-10-CM | POA: Diagnosis not present

## 2024-03-06 DIAGNOSIS — F410 Panic disorder [episodic paroxysmal anxiety] without agoraphobia: Secondary | ICD-10-CM | POA: Diagnosis not present

## 2024-03-06 DIAGNOSIS — F900 Attention-deficit hyperactivity disorder, predominantly inattentive type: Secondary | ICD-10-CM | POA: Diagnosis not present

## 2024-03-12 DIAGNOSIS — F410 Panic disorder [episodic paroxysmal anxiety] without agoraphobia: Secondary | ICD-10-CM | POA: Diagnosis not present

## 2024-03-12 DIAGNOSIS — F900 Attention-deficit hyperactivity disorder, predominantly inattentive type: Secondary | ICD-10-CM | POA: Diagnosis not present

## 2024-03-26 DIAGNOSIS — F900 Attention-deficit hyperactivity disorder, predominantly inattentive type: Secondary | ICD-10-CM | POA: Diagnosis not present

## 2024-03-26 DIAGNOSIS — F410 Panic disorder [episodic paroxysmal anxiety] without agoraphobia: Secondary | ICD-10-CM | POA: Diagnosis not present

## 2024-03-30 DIAGNOSIS — F410 Panic disorder [episodic paroxysmal anxiety] without agoraphobia: Secondary | ICD-10-CM | POA: Diagnosis not present

## 2024-03-30 DIAGNOSIS — F900 Attention-deficit hyperactivity disorder, predominantly inattentive type: Secondary | ICD-10-CM | POA: Diagnosis not present

## 2024-04-17 DIAGNOSIS — F410 Panic disorder [episodic paroxysmal anxiety] without agoraphobia: Secondary | ICD-10-CM | POA: Diagnosis not present

## 2024-04-17 DIAGNOSIS — F900 Attention-deficit hyperactivity disorder, predominantly inattentive type: Secondary | ICD-10-CM | POA: Diagnosis not present

## 2024-04-23 DIAGNOSIS — F410 Panic disorder [episodic paroxysmal anxiety] without agoraphobia: Secondary | ICD-10-CM | POA: Diagnosis not present

## 2024-04-23 DIAGNOSIS — F900 Attention-deficit hyperactivity disorder, predominantly inattentive type: Secondary | ICD-10-CM | POA: Diagnosis not present

## 2024-04-24 DIAGNOSIS — F410 Panic disorder [episodic paroxysmal anxiety] without agoraphobia: Secondary | ICD-10-CM | POA: Diagnosis not present

## 2024-04-30 DIAGNOSIS — F410 Panic disorder [episodic paroxysmal anxiety] without agoraphobia: Secondary | ICD-10-CM | POA: Diagnosis not present

## 2024-04-30 DIAGNOSIS — F900 Attention-deficit hyperactivity disorder, predominantly inattentive type: Secondary | ICD-10-CM | POA: Diagnosis not present

## 2024-05-07 DIAGNOSIS — F410 Panic disorder [episodic paroxysmal anxiety] without agoraphobia: Secondary | ICD-10-CM | POA: Diagnosis not present

## 2024-05-07 DIAGNOSIS — F900 Attention-deficit hyperactivity disorder, predominantly inattentive type: Secondary | ICD-10-CM | POA: Diagnosis not present

## 2024-05-14 DIAGNOSIS — F900 Attention-deficit hyperactivity disorder, predominantly inattentive type: Secondary | ICD-10-CM | POA: Diagnosis not present

## 2024-05-14 DIAGNOSIS — F410 Panic disorder [episodic paroxysmal anxiety] without agoraphobia: Secondary | ICD-10-CM | POA: Diagnosis not present

## 2024-05-21 DIAGNOSIS — F410 Panic disorder [episodic paroxysmal anxiety] without agoraphobia: Secondary | ICD-10-CM | POA: Diagnosis not present

## 2024-05-21 DIAGNOSIS — F900 Attention-deficit hyperactivity disorder, predominantly inattentive type: Secondary | ICD-10-CM | POA: Diagnosis not present

## 2024-05-28 DIAGNOSIS — F410 Panic disorder [episodic paroxysmal anxiety] without agoraphobia: Secondary | ICD-10-CM | POA: Diagnosis not present

## 2024-05-28 DIAGNOSIS — F900 Attention-deficit hyperactivity disorder, predominantly inattentive type: Secondary | ICD-10-CM | POA: Diagnosis not present

## 2024-06-04 DIAGNOSIS — F410 Panic disorder [episodic paroxysmal anxiety] without agoraphobia: Secondary | ICD-10-CM | POA: Diagnosis not present

## 2024-06-04 DIAGNOSIS — F900 Attention-deficit hyperactivity disorder, predominantly inattentive type: Secondary | ICD-10-CM | POA: Diagnosis not present

## 2024-06-19 DIAGNOSIS — F410 Panic disorder [episodic paroxysmal anxiety] without agoraphobia: Secondary | ICD-10-CM | POA: Diagnosis not present

## 2024-06-19 DIAGNOSIS — F900 Attention-deficit hyperactivity disorder, predominantly inattentive type: Secondary | ICD-10-CM | POA: Diagnosis not present

## 2024-06-25 DIAGNOSIS — F410 Panic disorder [episodic paroxysmal anxiety] without agoraphobia: Secondary | ICD-10-CM | POA: Diagnosis not present

## 2024-06-25 DIAGNOSIS — F900 Attention-deficit hyperactivity disorder, predominantly inattentive type: Secondary | ICD-10-CM | POA: Diagnosis not present

## 2024-07-03 DIAGNOSIS — F900 Attention-deficit hyperactivity disorder, predominantly inattentive type: Secondary | ICD-10-CM | POA: Diagnosis not present

## 2024-07-03 DIAGNOSIS — F410 Panic disorder [episodic paroxysmal anxiety] without agoraphobia: Secondary | ICD-10-CM | POA: Diagnosis not present

## 2024-07-09 DIAGNOSIS — F410 Panic disorder [episodic paroxysmal anxiety] without agoraphobia: Secondary | ICD-10-CM | POA: Diagnosis not present

## 2024-07-09 DIAGNOSIS — F900 Attention-deficit hyperactivity disorder, predominantly inattentive type: Secondary | ICD-10-CM | POA: Diagnosis not present

## 2024-07-16 DIAGNOSIS — E7800 Pure hypercholesterolemia, unspecified: Secondary | ICD-10-CM | POA: Diagnosis not present

## 2024-07-16 DIAGNOSIS — F900 Attention-deficit hyperactivity disorder, predominantly inattentive type: Secondary | ICD-10-CM | POA: Diagnosis not present

## 2024-07-16 DIAGNOSIS — E063 Autoimmune thyroiditis: Secondary | ICD-10-CM | POA: Diagnosis not present

## 2024-07-16 DIAGNOSIS — F419 Anxiety disorder, unspecified: Secondary | ICD-10-CM | POA: Diagnosis not present

## 2024-07-16 DIAGNOSIS — E611 Iron deficiency: Secondary | ICD-10-CM | POA: Diagnosis not present

## 2024-07-16 DIAGNOSIS — F410 Panic disorder [episodic paroxysmal anxiety] without agoraphobia: Secondary | ICD-10-CM | POA: Diagnosis not present

## 2024-08-26 NOTE — Progress Notes (Unsigned)
 [facility] [phone]   [phone]  CONSULT NOTE  REFERRING PHYSICIAN: Elberta Cone NP  REASON FOR CONSULTATION:  Iron Deficiency anemia  HPI Alyssa Mccarty is a 33 y.o. female with a past medical history significant for  hypothyroidism, Hashimoto's thyroiditis, ADHD, hypercholesterolemia, and GERD is referred to the clinic for deficiency anemia.  The patient saw her PCP on 07/16/2024.  She had lab work checked at that time due to history of iron deficiency anemia.  This is thought to be secondary to menorrhagia. She not have an upcoming appointment with her gynecologist but she will reach out to them to learn about her options regarding her menorrhagia.  She states that at some point she was on birth control pills but is hoping to avoid hormonal options or implants.   Her iron studies showed normal iron at 183, low ferritin at 5, normal iron binding capacity at 347, and elevated trans ferritin saturation 53%.  Her CBC showed normal hemoglobin at 14.6 and hematocrit 42.3.  Her MCV and RDW are normal.  Her platelet count and white blood cell count are normal.  The patient states that she was taking iron supplements on a regular basis when her labs were drawn on 07/16/2024.   She was taking iron with Vitron-C. She recently switched after doing research to Ferrochel which is 36 mg of Ferrous Bisglycinate.  She is taking this with vitamin C.  She is referred to clinic for further evaluation recommendations regarding her anemia.  She believes that she was first told that she was anemic in 2022 when she had an abortion.  Her hemoglobin in 2022 was 8.8.  And then improved in 2024.  She then had been on iron supplements fairly routinely since that time.  She has never required a blood transfusion or an iron infusion.  Menstrual history is notable for regular cycles every four weeks, lasting five days, with one day of very heavy flow requiring at  least four overnight maxi pads and passage of clots. Menstrual bleeding has increased over time. She previously trialed hormonal contraception for six months, which may have slightly reduced bleeding, but discontinued and is not currently using hormonal contraception. No intermenstrual spotting. No history of gynecologic malignancy or other bleeding disorders.  She does not have any dietary restrictions.  She estimates she eats red meat approximately twice per week.  She denies any history of bariatric state or dry or gastrointestinal surgery.   She experienced occasional dizziness and lightheadedness, which have improved since starting propranolol  for inappropriate sinus tachycardia. She believes this may have also been related to her anxiety. She had seen a cardiologist at that time and had a heart monitor. She was out of work for six months due to these symptoms but is now improved. Current energy level is stable. She reports occasional night sweats, but denies fever.   She has a history of constipation and uses Miralax  as needed, recently daily due to concern for worsening hemorrhoids. She developed a severe hemorrhoid last week with bleeding, described as blood on toilet paper and in the toilet bowl, but not mixed with stool. Hemorrhoidal bleeding occurs intermittently, typically once every few weeks, but was more frequent this week. No other sources of bleeding, including epistaxis, gingival bleeding, hematuria, hematemesis, melena, or rectal bleeding apart from hemorrhoids. She does not bruise easily, except possibly on her legs.  Regarding her energy, she states her energy is okay.  She has not noticed any significant  change in her energy over the last couple weeks or months.  She denies any recent syncopal episodes.   She does not take NSAIDs on a regular basis, although she did use them this week due to the hemorrhoid. She used to crave ice chips but not recently.  Denies any history of  bariatric surgery.   She denies family history of bone marrow disorder, malignancy, or anemia. Her parents are healthy expect her father who has HTN.   She works at [employer]. She is single. She does not have any children. She denies tobacco use. She estimates she drinks 1 alcoholic beverage per week.   HPI  Past Medical History:  Diagnosis Date   Anxiety    Thyroid  disease     No past surgical history on file.  Family History  Problem Relation Age of Onset   Hyperlipidemia Father    Hypertension Father    Diabetes Paternal Grandfather     Social History Social History   Tobacco Use   Smoking status: Never    Passive exposure: Never   Smokeless tobacco: Never  Vaping Use   Vaping status: Never Used  Substance Use Topics   Alcohol use: Yes    Comment: social use   Drug use: No    Allergies  Allergen Reactions   Septra [Bactrim] Swelling    FACE SWELLING    Current Outpatient Medications  Medication Sig Dispense Refill   desvenlafaxine (PRISTIQ) 25 MG 24 hr tablet Take 25 mg by mouth every morning.     escitalopram (LEXAPRO) 10 MG tablet Take 10 mg by mouth daily.     Iron-Vitamin C (VITRON-C) 65-125 MG TABS Take by mouth daily at 6 (six) AM.     lamoTRIgine (LAMICTAL) 25 MG tablet Take 25 mg by mouth daily.     levothyroxine (SYNTHROID) 75 MCG tablet Take 75 mcg by mouth daily before breakfast.     LORazepam (ATIVAN) 0.5 MG tablet Take 0.5 mg by mouth daily as needed for anxiety.     propranolol  (INDERAL ) 10 MG tablet Take 1 tablet (10 mg total) by mouth daily. 90 tablet 3   rosuvastatin  (CRESTOR ) 5 MG tablet Take 1 tablet (5 mg total) by mouth daily. 90 tablet 3   No current facility-administered medications for this visit.    REVIEW OF SYSTEMS:   Review of Systems  Constitutional: Stable fatigue. Negative for appetite change, chills, fever and unexpected weight change.  HENT: Negative for mouth sores, nosebleeds, sore throat and trouble swallowing.    Eyes: Negative for eye problems and icterus.  Respiratory: Negative for cough, hemoptysis, shortness of breath and wheezing.   Cardiovascular: Negative for chest pain and leg swelling.  Gastrointestinal: Positive for hemorrhoid bleeding and discomfort. Negative for abdominal pain, constipation, diarrhea, nausea and vomiting.  Genitourinary: Positive for menorrhagia. Negative for bladder incontinence, difficulty urinating, dysuria, frequency and hematuria.   Musculoskeletal: Negative for back pain, gait problem, neck pain and neck stiffness.  Skin: Negative for itching and rash.  Neurological: Negative for dizziness (improved), extremity weakness, gait problem, headaches, light-headedness and seizures.  Hematological: Negative for adenopathy. Does not bruise/bleed easily.  Psychiatric/Behavioral: Negative for confusion, depression and sleep disturbance. The patient is not nervous/anxious.     PHYSICAL EXAMINATION:  There were no vitals taken for this visit.  ECOG PERFORMANCE STATUS: 1  Physical Exam  Constitutional: Oriented to person, place, and time and well-developed, well-nourished, and in no distress.  HENT:  Head: Normocephalic and atraumatic.  Mouth/Throat:  Oropharynx is clear and moist. No oropharyngeal exudate.  Eyes: Conjunctivae are normal. Right eye exhibits no discharge. Left eye exhibits no discharge. No scleral icterus.  Neck: Normal range of motion. Neck supple.  Cardiovascular: Normal rate, regular rhythm, normal heart sounds and intact distal pulses.   Pulmonary/Chest: Effort normal and breath sounds normal. No respiratory distress. No wheezes. No rales.  Abdominal: Soft. Bowel sounds are normal. Exhibits no distension and no mass. There is no tenderness.  Musculoskeletal: Normal range of motion. Exhibits no edema.  Lymphadenopathy:    No cervical adenopathy.  Neurological: Alert and oriented to person, place, and time. Exhibits normal muscle tone. Gait normal.  Coordination normal.  Skin: Skin is warm and dry. No rash noted. Not diaphoretic. No erythema. No pallor.  Psychiatric: Mood, memory and judgment normal.  Vitals reviewed.  LABORATORY DATA: Lab Results  Component Value Date   WBC 6.0 09/05/2023   HGB 15.7 (H) 09/05/2023   HCT 44.4 09/05/2023   MCV 91.9 09/05/2023   PLT 262 09/05/2023      Chemistry      Component Value Date/Time   NA 135 08/15/2023 1313   K 4.5 08/15/2023 1313   CL 103 08/15/2023 1313   CO2 21 (L) 08/15/2023 1313   BUN 8 08/15/2023 1313   CREATININE 0.79 08/15/2023 1313      Component Value Date/Time   CALCIUM  9.2 08/15/2023 1313   ALKPHOS 50 08/15/2023 1313   AST 25 08/15/2023 1313   ALT 14 08/15/2023 1313   BILITOT 0.5 08/15/2023 1313       RADIOGRAPHIC STUDIES: No results found.  ASSESSMENT: This is a very pleasant 33 year old female with iron deficiency anemia.    Iron Deficiency Anemia - Evaluated today with Dr. Federico for significant anemia. - Labs ordered: CBC, CMP, iron studies, ferritin, vitamin B12, and reticulocyte panel - Results: Hgb 14.8 g/dL today. - Her iron studies are pending.  - If she continues to have low ferritin as she had in October 2025, Dr. Federico recommends venofer 300 mg weekly x3. I will order this at the [facility].  - Follow-up: Return about 6 weeks after iron infusion. Blood work 1 week prior to appointment - She will continue her iron supplement p.o. with vitamin C.    Menorrhagia - History of menorrhagia - Encouraged to follow up with GYN regarding her options  GI for hemorrhoids  - She has an appointment next week with GI due to hemorrhoid discomfort and rectal bleeding.  - She may want to consider taking stool softeners if she experiences frequent constipation.   Inappropriate sinus tachycardia -  Previously evaluated by cardiology and managed with propranolol , improving dizziness and lightheadedness. Symptoms have improved with  treatment.    The patient voices understanding of current disease status and treatment options and is in agreement with the current care plan.  All questions were answered. The patient knows to call the clinic with any problems, questions or concerns. We can certainly see the patient much sooner if necessary.  Thank you so much for allowing me to participate in the care of Alyssa Mccarty. I will continue to follow up the patient with you and assist in her care.  I spent 40 minutes counseling the patient face to face. The total time spent in the appointment was 60 minutes.  Disclaimer: This note was dictated with voice recognition software. Similar sounding words can inadvertently be transcribed and may not be corrected upon review.  Jahi Roza L Kelcy Laible August 26, 2024, 7:42 AM  I have read the above note and personally examined the patient. I agree with the assessment and plan as noted above.  Briefly Alyssa Mccarty is a 33 year old female who presents for iron deficiency anemia secondary to GYN bleeding.  The patient is currently taking p.o. iron therapy but this has not adequately boosted her iron stores.  As such I would recommend we pursue IV iron therapy.  Today we discussed the risks and benefits of IV iron therapy and what to expect moving forward.  She voiced understanding and reports that she would like to proceed with IV iron therapy.  Additionally I recommended continuing to follow with her OB/GYN in order to better control her menstrual cycles.  She notes that she has been on birth control pills in the past but is not currently on any medications to slow her cycles.  Additionally we discussed how to incorporate iron rich foods into her diet.  The patient voiced understanding of our findings and recommendations moving forward.  Will plan to see her back approximately 4 to 6 weeks after her last dose of IV iron.   Alyssa Mccarty Kidney, MD Department of Hematology/Oncology [facility] Phone: [phone] Pager: [phone] Email: [email or web address]

## 2024-08-28 ENCOUNTER — Inpatient Hospital Stay

## 2024-08-28 ENCOUNTER — Other Ambulatory Visit (HOSPITAL_COMMUNITY): Payer: Self-pay | Admitting: Physician Assistant

## 2024-08-28 ENCOUNTER — Telehealth: Payer: Self-pay | Admitting: Physician Assistant

## 2024-08-28 ENCOUNTER — Inpatient Hospital Stay: Attending: Physician Assistant | Admitting: Physician Assistant

## 2024-08-28 ENCOUNTER — Other Ambulatory Visit: Payer: Self-pay | Admitting: Physician Assistant

## 2024-08-28 VITALS — BP 96/65 | HR 91 | Temp 97.3°F | Ht 66.0 in | Wt 155.7 lb

## 2024-08-28 DIAGNOSIS — D509 Iron deficiency anemia, unspecified: Secondary | ICD-10-CM | POA: Diagnosis present

## 2024-08-28 DIAGNOSIS — N920 Excessive and frequent menstruation with regular cycle: Secondary | ICD-10-CM | POA: Insufficient documentation

## 2024-08-28 DIAGNOSIS — I4711 Inappropriate sinus tachycardia, so stated: Secondary | ICD-10-CM | POA: Diagnosis not present

## 2024-08-28 DIAGNOSIS — K649 Unspecified hemorrhoids: Secondary | ICD-10-CM | POA: Insufficient documentation

## 2024-08-28 LAB — CMP (CANCER CENTER ONLY)
ALT: 23 U/L (ref 0–44)
AST: 27 U/L (ref 15–41)
Albumin: 4.6 g/dL (ref 3.5–5.0)
Alkaline Phosphatase: 77 U/L (ref 38–126)
Anion gap: 9 (ref 5–15)
BUN: 10 mg/dL (ref 6–20)
CO2: 28 mmol/L (ref 22–32)
Calcium: 9.8 mg/dL (ref 8.9–10.3)
Chloride: 102 mmol/L (ref 98–111)
Creatinine: 0.83 mg/dL (ref 0.44–1.00)
GFR, Estimated: >60 mL/min (ref >60)
Glucose, Bld: 91 mg/dL (ref 70–99)
Potassium: 4.3 mmol/L (ref 3.5–5.1)
Sodium: 138 mmol/L (ref 135–145)
Total Bilirubin: 0.3 mg/dL (ref 0.0–1.2)
Total Protein: 7.6 g/dL (ref 6.5–8.1)

## 2024-08-28 LAB — CBC WITH DIFFERENTIAL (CANCER CENTER ONLY)
Abs Immature Granulocytes: 0 K/uL (ref 0.00–0.07)
Basophils Absolute: 0 K/uL (ref 0.0–0.1)
Basophils Relative: 1 %
Eosinophils Absolute: 0.1 K/uL (ref 0.0–0.5)
Eosinophils Relative: 3 %
HCT: 42.2 % (ref 36.0–46.0)
Hemoglobin: 14.8 g/dL (ref 12.0–15.0)
Immature Granulocytes: 0 %
Lymphocytes Relative: 29 %
Lymphs Abs: 1.3 K/uL (ref 0.7–4.0)
MCH: 31.2 pg (ref 26.0–34.0)
MCHC: 35.1 g/dL (ref 30.0–36.0)
MCV: 88.8 fL (ref 80.0–100.0)
Monocytes Absolute: 0.6 K/uL (ref 0.1–1.0)
Monocytes Relative: 14 %
Neutro Abs: 2.3 K/uL (ref 1.7–7.7)
Neutrophils Relative %: 53 %
Platelet Count: 212 K/uL (ref 150–400)
RBC: 4.75 MIL/uL (ref 3.87–5.11)
RDW: 11.9 % (ref 11.5–15.5)
WBC Count: 4.4 K/uL (ref 4.0–10.5)
nRBC: 0 % (ref 0.0–0.2)

## 2024-08-28 LAB — RETIC PANEL
Immature Retic Fract: 7.7 % (ref 2.3–15.9)
RBC.: 4.78 MIL/uL (ref 3.87–5.11)
Retic Count, Absolute: 81.7 K/uL (ref 19.0–186.0)
Retic Ct Pct: 1.7 % (ref 0.4–3.1)
Reticulocyte Hemoglobin: 34.6 pg (ref >27.9)

## 2024-08-28 LAB — IRON AND IRON BINDING CAPACITY (CC-WL,HP ONLY)
Iron: 50 ug/dL (ref 28–170)
Saturation Ratios: 15 % (ref 10.4–31.8)
TIBC: 339 ug/dL (ref 250–450)
UIBC: 289 ug/dL

## 2024-08-28 LAB — FERRITIN: Ferritin: 27 ng/mL (ref 11–307)

## 2024-08-28 LAB — VITAMIN B12: Vitamin B-12: 822 pg/mL (ref 180–914)

## 2024-08-28 NOTE — Telephone Encounter (Signed)
 I called the patient to review her labs.  Dr. Federico would recommend proceeding with the IV iron due to her saturation being 15 and her ferritin of 27 to bolster her iron stores.  I had placed orders earlier today for this.  The patient should expect to hear from the [facility] infusion center in the next few days.  Once this is scheduled I will send a scheduling message to arrange for follow-up lab visit a few weeks later and follow-up in the office to review the lab work 1 week later.  We also reviewed her other lab work from today.  She was appreciative of the call and in agreement with the plan.

## 2024-08-31 ENCOUNTER — Telehealth: Payer: Self-pay | Admitting: Pharmacy Technician

## 2024-08-31 NOTE — Telephone Encounter (Addendum)
 Auth Submission: NO AUTH NEEDED Site of care: Site of care: [facility] Payer: BCBS Medication & CPT/J Code(s) submitted: Venofer  (Iron  Sucrose) J1756 Diagnosis Code: D50.9 Route of submission (phone, fax, portal):  Phone # Fax # Auth type: Buy/Bill PB Units/visits requested: X3 DOSES Reference number:  Approval from: 08/31/24 to 09/16/25

## 2024-09-01 ENCOUNTER — Encounter: Payer: Self-pay | Admitting: Physician Assistant

## 2024-09-01 ENCOUNTER — Ambulatory Visit: Admitting: Gastroenterology

## 2024-09-01 ENCOUNTER — Encounter: Payer: Self-pay | Admitting: Gastroenterology

## 2024-09-01 VITALS — BP 114/78 | HR 98 | Ht 66.5 in | Wt 155.1 lb

## 2024-09-01 DIAGNOSIS — K602 Anal fissure, unspecified: Secondary | ICD-10-CM

## 2024-09-01 DIAGNOSIS — K219 Gastro-esophageal reflux disease without esophagitis: Secondary | ICD-10-CM

## 2024-09-01 DIAGNOSIS — K644 Residual hemorrhoidal skin tags: Secondary | ICD-10-CM

## 2024-09-01 DIAGNOSIS — K5909 Other constipation: Secondary | ICD-10-CM | POA: Diagnosis not present

## 2024-09-01 MED ORDER — AMBULATORY NON FORMULARY MEDICATION: 1 refills | Status: AC

## 2024-09-01 NOTE — Progress Notes (Unsigned)
 HPI :       Past Medical History:  Diagnosis Date   Anxiety    Thyroid  disease      No past surgical history on file. Family History  Problem Relation Age of Onset   Hyperlipidemia Father    Hypertension Father    Diabetes Paternal Grandfather    Social History[1] Current Outpatient Medications  Medication Sig Dispense Refill   amphetamine -dextroamphetamine  (ADDERALL XR) 30 MG 24 hr capsule Take 30 mg by mouth daily.     amphetamine -dextroamphetamine  (ADDERALL) 20 MG tablet Take 20 mg by mouth daily.     desvenlafaxine (PRISTIQ) 25 MG 24 hr tablet Take 25 mg by mouth every morning.     escitalopram (LEXAPRO) 10 MG tablet Take 10 mg by mouth daily.     Iron-Vitamin C (VITRON-C) 65-125 MG TABS Take by mouth daily at 6 (six) AM.     lamoTRIgine (LAMICTAL) 25 MG tablet Take 25 mg by mouth daily. (Patient taking differently: Take 75 mg by mouth daily.)     levothyroxine (SYNTHROID) 75 MCG tablet Take 75 mcg by mouth daily before breakfast.     LORazepam (ATIVAN) 0.5 MG tablet Take 0.5 mg by mouth daily as needed for anxiety.     propranolol  (INDERAL ) 10 MG tablet Take 1 tablet (10 mg total) by mouth daily. 90 tablet 3   rosuvastatin  (CRESTOR ) 5 MG tablet Take 1 tablet (5 mg total) by mouth daily. (Patient not taking: Reported on 08/28/2024) 90 tablet 3   No current facility-administered medications for this visit.   Allergies[2]   Review of Systems: All systems reviewed and negative except where noted in HPI.    No results found.  Physical Exam: BP 114/78 (BP Location: Right Arm, Patient Position: Sitting, Cuff Size: Normal)   Pulse 98   Ht 5' 6.5 (1.689 m)   Wt 155 lb 2 oz (70.4 kg)   BMI 24.66 kg/m  Constitutional: Pleasant,well-developed, Caucasian female in no acute distress. HEENT: Normocephalic and atraumatic. Conjunctivae are normal. No scleral icterus. Neck supple.  Cardiovascular: Normal rate, regular rhythm.  Pulmonary/chest: Effort normal and breath  sounds normal. No wheezing, rales or rhonchi. Abdominal: Soft, nondistended, nontender. Bowel sounds active throughout. There are no masses palpable. No hepatomegaly. Extremities: no edema Lymphadenopathy: No cervical adenopathy noted. Neurological: Alert and oriented to person place and time. Skin: Skin is warm and dry. No rashes noted. Psychiatric: Normal mood and affect. Behavior is normal.  CBC    Component Value Date/Time   WBC 4.4 08/28/2024 1221   WBC 6.0 09/05/2023 1420   RBC 4.78 08/28/2024 1221   RBC 4.75 08/28/2024 1221   HGB 14.8 08/28/2024 1221   HGB 13.0 02/27/2018 1430   HCT 42.2 08/28/2024 1221   HCT 38.8 02/27/2018 1430   PLT 212 08/28/2024 1221   PLT 232 02/27/2018 1430   MCV 88.8 08/28/2024 1221   MCV 83 02/27/2018 1430   MCH 31.2 08/28/2024 1221   MCHC 35.1 08/28/2024 1221   RDW 11.9 08/28/2024 1221   RDW 15.0 02/27/2018 1430   LYMPHSABS 1.3 08/28/2024 1221   LYMPHSABS 1.5 02/27/2018 1430   MONOABS 0.6 08/28/2024 1221   EOSABS 0.1 08/28/2024 1221   EOSABS 0.1 02/27/2018 1430   BASOSABS 0.0 08/28/2024 1221   BASOSABS 0.0 02/27/2018 1430    CMP     Component Value Date/Time   NA 138 08/28/2024 1221   K 4.3 08/28/2024 1221   CL 102 08/28/2024 1221   CO2 28  08/28/2024 1221   GLUCOSE 91 08/28/2024 1221   BUN 10 08/28/2024 1221   CREATININE 0.83 08/28/2024 1221   CALCIUM  9.8 08/28/2024 1221   PROT 7.6 08/28/2024 1221   ALBUMIN 4.6 08/28/2024 1221   AST 27 08/28/2024 1221   ALT 23 08/28/2024 1221   ALKPHOS 77 08/28/2024 1221   BILITOT 0.3 08/28/2024 1221   GFRNONAA >60 08/28/2024 1221   GFRAA >60 06/26/2019 1353       Latest Ref Rng & Units 08/28/2024   12:21 PM 09/05/2023    2:20 PM 08/15/2023    1:13 PM  CBC EXTENDED  WBC 4.0 - 10.5 K/uL 4.4  6.0  4.9   RBC 3.87 - 5.11 MIL/uL 3.87 - 5.11 MIL/uL 4.78    4.75  4.83  4.91   Hemoglobin 12.0 - 15.0 g/dL 85.1  84.2  84.2   HCT 36.0 - 46.0 % 42.2  44.4  46.3   Platelets 150 - 400 K/uL 212   262  229   NEUT# 1.7 - 7.7 K/uL 2.3  4.2    Lymph# 0.7 - 4.0 K/uL 1.3  1.2        ASSESSMENT AND PLAN:  Leila Lucie LABOR, MD     [1]  Social History Tobacco Use   Smoking status: Never    Passive exposure: Never   Smokeless tobacco: Never  Vaping Use   Vaping status: Never Used  Substance Use Topics   Alcohol use: Yes    Comment: social use   Drug use: No  [2]  Allergies Allergen Reactions   Septra [Bactrim] Swelling    FACE SWELLING

## 2024-09-01 NOTE — Patient Instructions (Signed)
 We have sent a prescription for nitroglycerin 0.125% gel to [facility]. You should apply a pea size amount to your rectum twice daily x 6 weeks.  [facility]'s information is below: Address: [address], [place], [place] [address]  [phone]  *Please DO NOT go directly from our office to pick up this medication! Give the pharmacy 1 day to process the prescription as this is compounded and takes time to make.  Continue preparation H for one more week.   Also, do sitz baths at night.   Please purchase the following medications over the counter and take as directed: Daily fiber supplement or a stool softener.   Continue famotidine daily.   _______________________________________________________  If your blood pressure at your visit was 140/90 or greater, please contact your primary care physician to follow up on this.  _______________________________________________________  If you are age 18 or older, your body mass index should be between 23-30. Your Body mass index is 24.66 kg/m. If this is out of the aforementioned range listed, please consider follow up with your Primary Care Provider.  If you are age 51 or younger, your body mass index should be between 19-25. Your Body mass index is 24.66 kg/m. If this is out of the aformentioned range listed, please consider follow up with your Primary Care Provider.   ________________________________________________________  The [facility] providers would like to encourage you to use MYCHART to communicate with providers for non-urgent requests or questions.  Due to long hold times on the telephone, sending your provider a message by [facility] may be a faster and more efficient way to get a response.  Please allow 48 business hours for a response.  Please remember that this is for non-urgent requests.  _______________________________________________________  [facility] is using a team-based approach to  care.  Your team is made up of your doctor and two to three APPS. Our APPS (Nurse Practitioners and Physician Assistants) work with your physician to ensure care continuity for you. They are fully qualified to address your health concerns and develop a treatment plan. They communicate directly with your gastroenterologist to care for you. Seeing the Advanced Practice Practitioners on your physician's team can help you by facilitating care more promptly, often allowing for earlier appointments, access to diagnostic testing, procedures, and other specialty referrals.

## 2024-09-04 ENCOUNTER — Telehealth: Payer: Self-pay | Admitting: Physician Assistant

## 2024-09-04 NOTE — Telephone Encounter (Signed)
 I left voicemail for patient regarding scheduled lab and office visit with Johnston Police, PA on 11/09/24 and 11/17/24.

## 2024-09-12 ENCOUNTER — Encounter: Payer: Self-pay | Admitting: *Deleted

## 2024-09-12 ENCOUNTER — Ambulatory Visit: Admission: EM | Admit: 2024-09-12 | Discharge: 2024-09-12 | Disposition: A | Discharge status: Home / Self Care

## 2024-09-12 DIAGNOSIS — J019 Acute sinusitis, unspecified: Secondary | ICD-10-CM

## 2024-09-12 MED ORDER — AMOXICILLIN-POT CLAVULANATE 875-125 MG PO TABS: 1.0000 | ORAL_TABLET | Freq: Two times a day (BID) | ORAL | 0 refills | Status: AC

## 2024-09-12 NOTE — ED Triage Notes (Signed)
 Pt states she was positive for flu A last week. She has had progressive symptoms since wednesday: facial pressure and burning pain, headache, and green/yellow mucous. She has been taking ibuprofen (took 800 mg this morning) and mucinex 

## 2024-09-12 NOTE — Discharge Instructions (Signed)

## 2024-09-12 NOTE — ED Provider Notes (Addendum)
 " [facility]    CSN: [id number] Arrival date & time: 09/12/24  0859      History   Chief Complaint Chief Complaint  Patient presents with   Facial Pain    HPI Alyssa Mccarty is a 33 y.o. female.   Pt presents today due purulent nasal drainage, headache, and sinus pressure for the past week after being diagnosed with flu A last week. Pt states that she has been using flonase and saline nasal spray for symptoms with no relief. Pt denies use of sudafed for symptoms out of fear of serotonin syndrome.   The history is provided by the patient.    Past Medical History:  Diagnosis Date   Anxiety    Thyroid  disease     Patient Active Problem List   Diagnosis Date Noted   Iron deficiency anemia 08/28/2024   Family history of familial hypercholesterolemia 01/27/2024   Palpitations 01/27/2024   Hashimoto's thyroiditis 12/03/2013   Hypothyroidism 12/01/2013    History reviewed. No pertinent surgical history.  OB History   No obstetric history on file.      Home Medications    Prior to Admission medications  Medication Sig Start Date End Date Taking? Authorizing Provider  AMBULATORY NON FORMULARY MEDICATION Medication Name: Nitroglycerin 0.125 % apply to rectum twice daily x 6 weeks 09/01/24  Yes Stacia Glendia BRAVO, MD  amoxicillin -clavulanate (AUGMENTIN ) 875-125 MG tablet Take 1 tablet by mouth every 12 (twelve) hours for 10 days. 09/15/24 09/25/24 Yes Andra Krabbe C, PA-C  amphetamine -dextroamphetamine  (ADDERALL XR) 30 MG 24 hr capsule Take 30 mg by mouth daily. 07/18/24 09/14/25 Yes [provider]  amphetamine -dextroamphetamine  (ADDERALL) 20 MG tablet Take 20 mg by mouth daily. 07/18/24 09/14/25 Yes [provider]  desvenlafaxine (PRISTIQ) 25 MG 24 hr tablet Take 25 mg by mouth every morning.   Yes [provider]  escitalopram (LEXAPRO) 10 MG tablet Take 10 mg by mouth daily. 01/20/24  Yes [provider]  Iron-Vitamin  C (VITRON-C) 65-125 MG TABS Take by mouth daily at 6 (six) AM.   Yes [provider]  lamoTRIgine (LAMICTAL) 25 MG tablet Take 25 mg by mouth daily. Patient taking differently: Take 75 mg by mouth daily. 12/21/23  Yes [provider]  levothyroxine (SYNTHROID) 75 MCG tablet Take 75 mcg by mouth daily before breakfast.   Yes [provider]  LORazepam (ATIVAN) 0.5 MG tablet Take 0.5 mg by mouth daily as needed for anxiety.   Yes [provider]  propranolol  (INDERAL ) 10 MG tablet Take 1 tablet (10 mg total) by mouth daily. 01/27/24  Yes Santo Stanly LABOR, MD    Family History Family History  Problem Relation Age of Onset   Hyperlipidemia Father    Hypertension Father    Diabetes Paternal Grandfather     Social History Social History[1]   Allergies   Sulfamethoxazole-trimethoprim and Septra [bactrim]   Review of Systems Review of Systems   Physical Exam Triage Vital Signs ED Triage Vitals  Encounter Vitals Group     BP 09/12/24 1042 110/76     Girls Systolic BP Percentile --      Girls Diastolic BP Percentile --      Boys Systolic BP Percentile --      Boys Diastolic BP Percentile --      Pulse Rate 09/12/24 1042 (!) 101     Resp 09/12/24 1042 18     Temp 09/12/24 1042 98.6 F (37 C)  Temp Source 09/12/24 1042 Oral     SpO2 09/12/24 1042 97 %     Weight --      Height --      Head Circumference --      Peak Flow --      Pain Score 09/12/24 1040 7     Pain Loc --      Pain Education --      Exclude from Growth Chart --    No data found.  Updated Vital Signs BP 110/76 (BP Location: Left Arm)   Pulse (!) 101   Temp 98.6 F (37 C) (Oral)   Resp 18   LMP 09/12/2024   SpO2 97%   Visual Acuity Right Eye Distance:   Left Eye Distance:   Bilateral Distance:    Right Eye Near:   Left Eye Near:    Bilateral Near:     Physical Exam Vitals and nursing note reviewed.  Constitutional:      General: She is not in acute  distress.    Appearance: Normal appearance. She is not ill-appearing, toxic-appearing or diaphoretic.  HENT:     Nose: Congestion (moderately enlarged turbinates) present. No rhinorrhea.     Right Sinus: No maxillary sinus tenderness or frontal sinus tenderness.     Left Sinus: No maxillary sinus tenderness or frontal sinus tenderness.     Comments: No tenderness to palpation of upper lip    Mouth/Throat:     Mouth: Mucous membranes are moist.     Pharynx: Oropharynx is clear. No oropharyngeal exudate or posterior oropharyngeal erythema.  Eyes:     General: No scleral icterus. Cardiovascular:     Rate and Rhythm: Normal rate and regular rhythm.     Heart sounds: Normal heart sounds.  Pulmonary:     Effort: Pulmonary effort is normal. No respiratory distress.     Breath sounds: Normal breath sounds. No wheezing or rhonchi.  Skin:    General: Skin is warm.  Neurological:     Mental Status: She is alert and oriented to person, place, and time.  Psychiatric:        Mood and Affect: Mood normal.        Behavior: Behavior normal.      UC Treatments / Results  Labs (all labs ordered are listed, but only abnormal results are displayed) Labs Reviewed - No data to display  EKG   Radiology No results found.  Procedures Procedures (including critical care time)  Medications Ordered in UC Medications - No data to display  Initial Impression / Assessment and Plan / UC Course  I have reviewed the triage vital signs and the nursing notes.  Pertinent labs & imaging results that were available during my care of the patient were reviewed by me and considered in my medical decision making (see chart for details).     Final Clinical Impressions(s) / UC Diagnoses   Final diagnoses:  Acute sinusitis, recurrence not specified, unspecified location     Discharge Instructions      You have been diagnosed with a sinus infection today, some are caused by viruses and others are  caused by bacteria.  If your symptoms have been going on for less than 7 days it is most likely that you have a viral sinus infection.  Antibiotics will not work for this and it will have to run its course.  Sinus rinses (using a Nettie pot) or saline rinses are helpful as well as pseudoephedrine, and nasal  sprays along with ibuprofen and Tylenol for pain.  If you have had your symptoms for more than 7 days you most likely have a bacterial infection and will be prescribed antibiotics.  Supportive measures given for viral sinus infections will also be helpful for bacterial infections.  If you are using antibiotics you should start to feel better in 2 to 3 days but it is important that you complete antibiotics in their entirety.     ED Prescriptions     Medication Sig Dispense Auth. Provider   amoxicillin -clavulanate (AUGMENTIN ) 875-125 MG tablet Take 1 tablet by mouth every 12 (twelve) hours for 10 days. 20 tablet Andra Corean BROCKS, PA-C      PDMP not reviewed this encounter.    Andra Corean BROCKS, PA-C 09/12/24 1117     [1]  Social History Tobacco Use   Smoking status: Never    Passive exposure: Never   Smokeless tobacco: Never  Vaping Use   Vaping status: Never Used  Substance Use Topics   Alcohol use: Yes    Comment: social use   Drug use: No     Andra Corean BROCKS, PA-C 09/12/24 1118  "

## 2024-09-25 ENCOUNTER — Ambulatory Visit

## 2024-09-25 MED ORDER — IRON SUCROSE 300 MG IVPB - SIMPLE MED: 300.0000 mg | Freq: Once | Status: DC

## 2024-09-28 ENCOUNTER — Encounter: Payer: Self-pay | Admitting: Physician Assistant

## 2024-10-02 ENCOUNTER — Encounter: Payer: Self-pay | Admitting: Physician Assistant

## 2024-10-02 ENCOUNTER — Ambulatory Visit (INDEPENDENT_AMBULATORY_CARE_PROVIDER_SITE_OTHER)

## 2024-10-02 VITALS — BP 103/69 | HR 80 | Temp 97.8°F | Resp 16 | Ht 66.0 in | Wt 156.8 lb

## 2024-10-02 DIAGNOSIS — D509 Iron deficiency anemia, unspecified: Secondary | ICD-10-CM | POA: Diagnosis not present

## 2024-10-02 MED ORDER — IRON SUCROSE 300 MG IVPB - SIMPLE MED
300.0000 mg | Freq: Once | Status: AC
  Administered 2024-10-02: 300 mg via INTRAVENOUS
  Filled 2024-10-02: qty 265

## 2024-10-02 NOTE — Progress Notes (Signed)
 Diagnosis: , Acute Anemia  Provider:  Lonna Coder MD  Procedure: IV Infusion  IV Type: Peripheral, IV Location: R Forearm  Venofer  (Iron  Sucrose), Dose: 300 mg  Infusion Start Time: 1049  Infusion Stop Time: 1230  Post Infusion IV Care: Observation period completed and Peripheral IV Discontinued  Discharge: Condition: Good, Destination: Home . AVS Provided  Performed by:  Donny Childes, RN

## 2024-10-02 NOTE — Patient Instructions (Signed)

## 2024-10-08 ENCOUNTER — Encounter: Payer: Self-pay | Admitting: Physician Assistant

## 2024-10-09 ENCOUNTER — Ambulatory Visit

## 2024-10-09 VITALS — BP 115/69 | HR 84 | Temp 98.0°F | Resp 16 | Ht 66.0 in | Wt 156.0 lb

## 2024-10-09 DIAGNOSIS — D509 Iron deficiency anemia, unspecified: Secondary | ICD-10-CM

## 2024-10-09 MED ORDER — IRON SUCROSE 300 MG IVPB - SIMPLE MED
300.0000 mg | Freq: Once | Status: AC
  Administered 2024-10-09: 300 mg via INTRAVENOUS
  Filled 2024-10-09: qty 265

## 2024-10-09 NOTE — Progress Notes (Signed)
 Diagnosis: Iron  Deficiency Anemia  Provider:  Mannam, Praveen MD  Procedure: IV Infusion  IV Type: Peripheral, IV Location: R Forearm   Venofer  (Iron  Sucrose), Dose: 300 mg  Infusion Start Time: 1047  Infusion Stop Time: 1225  Post Infusion IV Care: Observation period completed and Peripheral IV Discontinued  Discharge: Condition: Good, Destination: Home . AVS Declined  Performed by: Donny Childes, RN

## 2024-10-16 ENCOUNTER — Ambulatory Visit (INDEPENDENT_AMBULATORY_CARE_PROVIDER_SITE_OTHER): Admitting: *Deleted

## 2024-10-16 VITALS — BP 112/74 | HR 91 | Temp 97.9°F | Resp 16 | Ht 66.0 in | Wt 157.0 lb

## 2024-10-16 DIAGNOSIS — D509 Iron deficiency anemia, unspecified: Secondary | ICD-10-CM | POA: Diagnosis not present

## 2024-10-16 MED ORDER — IRON SUCROSE 300 MG IVPB - SIMPLE MED
300.0000 mg | Freq: Once | Status: AC
  Administered 2024-10-16: 300 mg via INTRAVENOUS
  Filled 2024-10-16: qty 265

## 2024-10-16 NOTE — Progress Notes (Signed)
 Diagnosis: Iron  Deficiency Anemia  Provider:  Mannam, Praveen MD  Procedure: IV Infusion  IV Type: Peripheral, IV Location: R Antecubital  Venofer  (Iron  Sucrose), Dose: 300 mg  Infusion Start Time: 1055 am  Infusion Stop Time: 1222 pm  Post Infusion IV Care: Observation period completed and Peripheral IV Discontinued  Discharge: Condition: Good, Destination: Home . AVS Declined  Performed by:  Trudy Lamarr LABOR, RN

## 2024-10-22 ENCOUNTER — Telehealth: Payer: Self-pay

## 2024-10-22 NOTE — Telephone Encounter (Signed)
 Hiiii, This pt called to move upcoming appts to a Thursday or Friday, I got her rescheduled but she did want me to check and see if she is supposed to be taking Oral iron ? She said she had 3 Iron  infusion over the course of 3 weeks and did not know if she was supposed to also be taking Iron  Orally then or in between them, she said that she didn't but she can if she needs to by her next appt.    Pt advised with VU to continue taking oral iron  with vitamin C

## 2024-11-09 ENCOUNTER — Inpatient Hospital Stay

## 2024-11-12 ENCOUNTER — Inpatient Hospital Stay: Payer: Self-pay | Attending: Physician Assistant

## 2024-11-17 ENCOUNTER — Inpatient Hospital Stay: Admitting: Physician Assistant

## 2024-11-20 ENCOUNTER — Inpatient Hospital Stay: Payer: Self-pay | Attending: Physician Assistant | Admitting: Physician Assistant
